# Patient Record
Sex: Female | Born: 1975 | Race: Black or African American | Hispanic: No | Marital: Married | Smoking: Never smoker
Health system: Southern US, Community
[De-identification: ages and names within clinical notes are randomized; demographics above are authoritative.]

## PROBLEM LIST (undated history)

## (undated) DIAGNOSIS — Z8489 Family history of other specified conditions: Secondary | ICD-10-CM

## (undated) DIAGNOSIS — Z87442 Personal history of urinary calculi: Secondary | ICD-10-CM

## (undated) HISTORY — PX: TONSILLECTOMY: SUR1361

## (undated) HISTORY — PX: HERNIA REPAIR: SHX51

## (undated) HISTORY — PX: KNEE SURGERY: SHX244

## (undated) HISTORY — PX: ABDOMINAL HYSTERECTOMY: SHX81

---

## 2012-05-06 HISTORY — PX: BREAST SURGERY: SHX581

## 2017-05-06 DIAGNOSIS — I209 Angina pectoris, unspecified: Secondary | ICD-10-CM

## 2017-05-06 HISTORY — DX: Angina pectoris, unspecified: I20.9

## 2017-12-10 DIAGNOSIS — R0609 Other forms of dyspnea: Secondary | ICD-10-CM | POA: Insufficient documentation

## 2017-12-10 DIAGNOSIS — M7989 Other specified soft tissue disorders: Secondary | ICD-10-CM | POA: Insufficient documentation

## 2018-06-22 DIAGNOSIS — R609 Edema, unspecified: Secondary | ICD-10-CM | POA: Insufficient documentation

## 2018-06-22 DIAGNOSIS — I872 Venous insufficiency (chronic) (peripheral): Secondary | ICD-10-CM | POA: Insufficient documentation

## 2018-06-22 DIAGNOSIS — R600 Localized edema: Secondary | ICD-10-CM | POA: Insufficient documentation

## 2019-08-04 ENCOUNTER — Other Ambulatory Visit: Payer: Self-pay

## 2019-08-04 ENCOUNTER — Encounter: Payer: Self-pay | Admitting: Podiatry

## 2019-08-04 ENCOUNTER — Ambulatory Visit: Payer: BC Managed Care – PPO | Admitting: Podiatry

## 2019-08-04 VITALS — Temp 98.0°F

## 2019-08-04 DIAGNOSIS — B351 Tinea unguium: Secondary | ICD-10-CM | POA: Diagnosis not present

## 2019-08-04 DIAGNOSIS — L600 Ingrowing nail: Secondary | ICD-10-CM

## 2019-08-04 NOTE — Addendum Note (Signed)
Addended by: Celene Skeen A on: 08/04/2019 02:33 PM   Modules accepted: Orders

## 2019-08-04 NOTE — Patient Instructions (Signed)

## 2019-08-04 NOTE — Addendum Note (Signed)
Addended by: Celene Skeen A on: 08/04/2019 02:52 PM   Modules accepted: Orders

## 2019-08-04 NOTE — Progress Notes (Signed)
Subjective:   Patient ID: Nancy Reid, female   DOB: 44 y.o.   MRN: [id number]   HPI Patient presents stating my big toenail right has given me trouble off and on and I know someday I will probably have to have it removed permanently but I do not want to do that if I do not have to in my third nail also my right is been very effective.  States is been going on for a long time and patient does not smoke and likes to be active but is having trouble wearing shoes with the pain   Review of Systems  All other systems reviewed and are negative.       Objective:  Physical Exam Vitals and nursing note reviewed.  Constitutional:      Appearance: She is well-developed.  Pulmonary:     Effort: Pulmonary effort is normal.  Musculoskeletal:        General: Normal range of motion.  Skin:    General: Skin is warm.  Neurological:     Mental Status: She is alert.     Neurovascular status intact muscle strength adequate range of motion within normal limits with patient noted to have a thickened dystrophic right hallux nail that is loose and moderately painful when palpated.  Third nails also thickened but not loose and patient has good digital perfusion well oriented x3     Assessment:  Probability for damaged hallux nail and possibility for mycotic component also present     Plan:  H&P reviewed both conditions and working to remove the nail but she does not want it permanent and I did explain it will recur and will most likely be thick again or loose again and that long-term is probably not require removal.  She understands but does not want to do today and we are going to send off for fungal culture and consider oral medications or laser  Anesthetized the hallux 60 mg like Marcaine mixture sterile prep applied and went ahead and remove the nail completely cleaned out the bed flushed the area and applied sterile dressing.  Sending off for culture of the nailbed for fungus and we will see the  results and decide what might be appropriate

## 2019-08-05 ENCOUNTER — Telehealth: Payer: Self-pay | Admitting: *Deleted

## 2019-08-05 MED ORDER — IBUPROFEN 800 MG PO TABS: 800.0000 mg | ORAL_TABLET | Freq: Three times a day (TID) | ORAL | 0 refills | Status: DC | PRN

## 2019-08-05 NOTE — Telephone Encounter (Signed)
Pt states she had an ingrown toenail procedure yesterday and is in terrible pain and would like Ibuprofen 800mg .

## 2019-08-05 NOTE — Telephone Encounter (Signed)
I spoke with pt and asked if she had begun the soaking and she stated not yet she was in too much pain. I told pt the removal of the snug post op dressing would give her some relief and so would the soaking. I told her Dr. Paulla Dolly had ordered the Ibuprofen and she could take it every 8 hours and could take regular strength tylenol as the package instructs in between the ibuprofen for additional pain coverage. Pt states understanding.

## 2019-09-02 ENCOUNTER — Encounter: Payer: Self-pay | Admitting: Podiatry

## 2019-09-02 ENCOUNTER — Other Ambulatory Visit: Payer: Self-pay

## 2019-09-02 ENCOUNTER — Telehealth: Payer: Self-pay | Admitting: *Deleted

## 2019-09-02 ENCOUNTER — Ambulatory Visit: Payer: BC Managed Care – PPO | Admitting: Podiatry

## 2019-09-02 VITALS — Temp 97.3°F

## 2019-09-02 DIAGNOSIS — B351 Tinea unguium: Secondary | ICD-10-CM

## 2019-09-02 DIAGNOSIS — L03031 Cellulitis of right toe: Secondary | ICD-10-CM

## 2019-09-02 LAB — HEPATIC FUNCTION PANEL
AG Ratio: 1.5 (calc) (ref 1.0–2.5)
ALT: 7 U/L (ref 6–29)
AST: 14 U/L (ref 10–30)
Albumin: 4.2 g/dL (ref 3.6–5.1)
Alkaline phosphatase (APISO): 41 U/L (ref 31–125)
Bilirubin, Direct: 0.1 mg/dL (ref 0.0–0.2)
Globulin: 2.8 g/dL (calc) (ref 1.9–3.7)
Indirect Bilirubin: 0.3 mg/dL (calc) (ref 0.2–1.2)
Total Bilirubin: 0.4 mg/dL (ref 0.2–1.2)
Total Protein: 7 g/dL (ref 6.1–8.1)

## 2019-09-02 MED ORDER — TERBINAFINE HCL 250 MG PO TABS: 250.0000 mg | ORAL_TABLET | Freq: Every day | ORAL | 0 refills | Status: DC

## 2019-09-02 NOTE — Telephone Encounter (Signed)
Pt states she was seen by Dr. Paulla Dolly today and did not pick up the cushion she was to have in her shoe.

## 2019-09-02 NOTE — Patient Instructions (Signed)

## 2019-09-02 NOTE — Progress Notes (Signed)
Subjective:   Patient ID: Nancy Reid, female   DOB: 44 y.o.   MRN: [id number]   HPI Patient presents stating that it still tender in her toe proximal to where the nail was removed with an area of inflammation and wants to also review her cultures today   ROS      Objective:  Physical Exam  Neurovascular status found to be intact with patient's right hallux nail that was removed healing well but there is some crusted tissue and on the lateral side proximal there is some inflammation underneath the nailbed.  It is painful when pressed and irritated.  Patient does have moderate fungal infiltration     Assessment:  Possibility for an inflammatory condition or possibility for abscess or infective pathology with mycotic nail     Plan:  H&P educated her on this and at this point I want to go ahead and clean the nailbed up and explore this lateral side to make sure there is no large abscess.  I infiltrated the hallux 60 mg like Marcaine mixture using sterile instrumentation I removed the new growth nail plate I cleaned that out I found there to be some inflammation of the lateral side but I did not note any pus.  I flushed the area out applied sterile dressing and we will get her started on an antifungal Lamisil unexplained risk and I do want to get liver function studies done.  Patient will be seen back in 6 months to ascertain how a new nail regrows and she understands ultimately may require permanent removal and she will be seen back at that time

## 2019-10-08 ENCOUNTER — Telehealth: Payer: Self-pay | Admitting: Podiatry

## 2019-10-08 NOTE — Telephone Encounter (Signed)
Pt called stating her toe was red and felt warm to touch I offered appt next week with regal the providers in offivce today 10/08/19 were at their copacity

## 2019-10-13 ENCOUNTER — Ambulatory Visit (INDEPENDENT_AMBULATORY_CARE_PROVIDER_SITE_OTHER): Payer: BC Managed Care – PPO

## 2019-10-13 ENCOUNTER — Other Ambulatory Visit: Payer: Self-pay | Admitting: Podiatry

## 2019-10-13 ENCOUNTER — Ambulatory Visit: Payer: BC Managed Care – PPO | Admitting: Podiatry

## 2019-10-13 ENCOUNTER — Other Ambulatory Visit: Payer: Self-pay

## 2019-10-13 VITALS — Temp 98.7°F

## 2019-10-13 DIAGNOSIS — L02611 Cutaneous abscess of right foot: Secondary | ICD-10-CM

## 2019-10-13 DIAGNOSIS — M79674 Pain in right toe(s): Secondary | ICD-10-CM

## 2019-10-13 DIAGNOSIS — L03031 Cellulitis of right toe: Secondary | ICD-10-CM

## 2019-10-13 MED ORDER — HYDROCODONE-ACETAMINOPHEN 10-325 MG PO TABS: 1.0000 | ORAL_TABLET | Freq: Three times a day (TID) | ORAL | 0 refills | Status: AC | PRN

## 2019-10-13 MED ORDER — AMOXICILLIN-POT CLAVULANATE 875-125 MG PO TABS: 1.0000 | ORAL_TABLET | Freq: Two times a day (BID) | ORAL | 0 refills | Status: DC

## 2019-10-13 NOTE — Progress Notes (Signed)
Subjective:   Patient ID: Nancy Reid, female   DOB: 44 y.o.   MRN: [id number]   HPI Patient states her toe was doing fine and then 1 week ago it started to swell and become very painful over the last few days.  She saw her family physician and had an x-ray which was negative for any bone infection was placed on doxycycline and has had blood work done.  It did indicate her fasting sugar was elevated at 125 but she had no elevation of her white blood count or other indications of infective process   ROS      Objective:  Physical Exam  Neurovascular status was intact with patient found to have a swollen right hallux with at the inner phalangeal joint a small area of discoloration of the tissue on the lateral side with possible abscess of this tissue.  Patient had no proximal edema erythema drainage noted and her temperature was normal and is been taken several times     Assessment:  Appearance of a localized infection abscess process right hallux with a well-healed nail site with crusted tissue with the area of abscess at the inner phalangeal joint     Plan:  H&P precautionary x-rays taken in today I did a proximal nerve block of the area.  Utilizing a 15 blade I opened up the area and did note a purulent pus formation.  I cultured this and I was able to express out a significant amount of purulent fluid which reduce the pressure against the digit.  I left it open and applied a sterile dressing and instructed on elevation of the foot surgical shoe usage which was dispensed today and due to the fact that it looks strictly like staph type infection I placed her on Augmentin 875 versus the doxycycline which I am hoping will be more effective.  We will get the results of culture fairly soon and I gave her strict instructions to take her temperature 3 times a day keep her foot elevated and if any redness were to occur in her foot or any systemic signs of infection were to occur she is to go straight  to the emergency room for IV antibiotics.  I did tell her that it is possible organ and need to open this up more if abscess was not resolved but I am hopeful what we did today we will do that along with the antibiotic soaks and taking it easy on the foot.  Patient will be seen back 1 week and I gave her my cell phone number if any issues were to occur.  I also wrote her for hydrocodone for pain relief  X-rays indicate that there is no signs of osteolysis of the bone or soft tissue bubbling

## 2019-10-15 ENCOUNTER — Telehealth: Payer: Self-pay | Admitting: *Deleted

## 2019-10-15 NOTE — Telephone Encounter (Signed)
I reviewed the clinicals and informed pt Dr. Paulla Dolly had wanted her on the augmentin only.

## 2019-10-15 NOTE — Telephone Encounter (Signed)
Patient is having taking 2 antibiotics (Doxycycline, Augmentin)and now is nauseous. Can she stop taking one of those and maybe call in Prednisone or something else?

## 2019-10-18 LAB — WOUND CULTURE
MICRO NUMBER:: 10576497
SPECIMEN QUALITY:: ADEQUATE

## 2019-10-20 ENCOUNTER — Other Ambulatory Visit: Payer: Self-pay

## 2019-10-20 ENCOUNTER — Ambulatory Visit: Payer: BC Managed Care – PPO | Admitting: Podiatry

## 2019-10-20 ENCOUNTER — Encounter: Payer: Self-pay | Admitting: Podiatry

## 2019-10-20 DIAGNOSIS — L02611 Cutaneous abscess of right foot: Secondary | ICD-10-CM

## 2019-10-20 DIAGNOSIS — L03031 Cellulitis of right toe: Secondary | ICD-10-CM

## 2019-10-20 MED ORDER — DOXYCYCLINE HYCLATE 100 MG PO TABS: 100.0000 mg | ORAL_TABLET | Freq: Two times a day (BID) | ORAL | 0 refills | Status: DC

## 2019-10-21 NOTE — Progress Notes (Signed)
Subjective:   Patient ID: Nancy Reid, female   DOB: 44 y.o.   MRN: [id number]   HPI Patient states she is feeling much better but the Augmentin is been strong for her so she is only been taking 1 a day and she did better with the doxycycline.  States that the swelling has really gone down and she did not note any drainage today but did have light drainage up till that time   ROS      Objective:  Physical Exam  Neurovascular status intact with patient's right hallux looking much better the small incision be made at the inner phalangeal joint healed with edema which has reduced dramatically.  There is mild tenderness when I pressed at the top of the big toe but improved from previous visit and no proximal edema erythema or drainage noted     Assessment:  Improving from what appears to be an abscess in the inner phalangeal joint right big toe with good response to I&D drainage soaks and antibiotics     Plan:  H&P reviewed condition and applied sterile dressing.  I do think that we should continue antibiotics for about 10 more days and at this point I went ahead and I want her to take doxycycline if she can tolerate this better and I gave her strict instructions of any swelling should recur or any pathology to let us know but I am hoping this will be the end of the problem and I educated her on continued soaks compression being careful in dressings

## 2019-11-26 NOTE — Patient Instructions (Addendum)
Get your Covid test at [facility] in [place] on 12/06/19 at:  10:05   [place] procedure is scheduled on 12/09/19   Report to [facility]  At  6:00  A.M.   Call this number if you have problems the morning of [phone]   OUR ADDRESS IS [place], WE ARE LOCATED IN THE [facility]ICAL [facility] WITH [facility].   Remember:  Do not eat food or drink liquids after midnight.   Take these medicines the morning of surgery with A SIP OF WATER: Lamisil   Do not wear jewelry, make-up or nail polish.  Do not wear lotions, powders, or perfumes, or deoderant.  Do not shave 48 hours prior to surgery.   Do not bring valuables to the hospital.  [facility] is not responsible for any belongings or valuables.  Contacts, dentures or bridgework may not be worn into surgery.    For patients admitted to the hospital, discharge time will be determined by your treatment team.  Patients discharged the day of surgery will not be allowed to drive home.   Special instructions:  Bring your prescription meds in their original bottles  Please read over the following fact sheets that you were given:       [facility] - Preparing for Surgery Before surgery, you can play an important role .  Because skin is not sterile, your skin needs to be as free of germs as possible.   You can reduce the number of germs on your skin by washing with CHG (chlorahexidine gluconate) soap before surgery.   CHG is an antiseptic cleaner which kills germs and bonds with the skin to continue killing germs even after washing. Please DO NOT use if you have an allergy to CHG or antibacterial soaps.   If your skin becomes reddened/irritated stop using the CHG and inform your nurse when you arrive at Short Stay. Do not shave (including legs and underarms) for at least 48 hours prior to the first CHG shower.   Please follow these instructions carefully:  1.  Shower with CHG  Soap the night before surgery and the  morning of Surgery.  2.  If you choose to wash your hair, wash your hair first as usual with your  normal  shampoo.  3.  After you shampoo, rinse your hair and body thoroughly to remove the  shampoo.                                        4.  Use CHG as you would any other liquid soap.  You can apply chg directly  to the skin and wash                       Gently with a scrungie or clean washcloth.  5.  Apply the CHG Soap to your body ONLY FROM THE NECK DOWN.   Do not use on face/ open                           Wound or open sores. Avoid contact with eyes, ears mouth and genitals (private parts).                       Wash face,  Genitals (  private parts) with your normal soap.             6.  Wash thoroughly, paying special attention to the area where your surgery  will be performed.  7.  Thoroughly rinse your body with warm water from the neck down.  8.  DO NOT shower/wash with your normal soap after using and rinsing off  the CHG Soap.             9.  Pat yourself dry with a clean towel.            10.  Wear clean pajamas.            11.  Place clean sheets on your bed the night of your first shower and do not  sleep with pets. Day of Surgery : Do not apply any lotions/deodorants the morning of surgery.  Please wear clean clothes to the hospital/surgery center.  FAILURE TO FOLLOW THESE INSTRUCTIONS MAY RESULT IN THE CANCELLATION OF YOUR SURGERY PATIENT SIGNATURE_________________________________  NURSE SIGNATURE__________________________________  ________________________________________________________________________

## 2019-11-29 ENCOUNTER — Encounter (HOSPITAL_COMMUNITY)
Admission: RE | Admit: 2019-11-29 | Discharge: 2019-11-29 | Disposition: A | Discharge status: Home / Self Care | Payer: BC Managed Care – PPO | Source: Ambulatory Visit | Attending: Obstetrics and Gynecology | Admitting: Obstetrics and Gynecology

## 2019-11-29 ENCOUNTER — Encounter (HOSPITAL_COMMUNITY): Payer: Self-pay

## 2019-11-29 ENCOUNTER — Other Ambulatory Visit: Payer: Self-pay

## 2019-11-29 DIAGNOSIS — Z01812 Encounter for preprocedural laboratory examination: Secondary | ICD-10-CM | POA: Insufficient documentation

## 2019-11-29 HISTORY — DX: Family history of other specified conditions: Z84.89

## 2019-11-29 HISTORY — DX: Personal history of urinary calculi: Z87.442

## 2019-11-29 LAB — CBC
HCT: 41.2 % (ref 36.0–46.0)
Hemoglobin: 13.1 g/dL (ref 12.0–15.0)
MCH: 30 pg (ref 26.0–34.0)
MCHC: 31.8 g/dL (ref 30.0–36.0)
MCV: 94.5 fL (ref 80.0–100.0)
Platelets: 301 10*3/uL (ref 150–400)
RBC: 4.36 MIL/uL (ref 3.87–5.11)
RDW: 12.8 % (ref 11.5–15.5)
WBC: 4.5 10*3/uL (ref 4.0–10.5)
nRBC: 0 % (ref 0.0–0.2)

## 2019-12-06 ENCOUNTER — Other Ambulatory Visit (HOSPITAL_COMMUNITY)
Admission: RE | Admit: 2019-12-06 | Discharge: 2019-12-06 | Disposition: A | Discharge status: Home / Self Care | Payer: BC Managed Care – PPO | Source: Ambulatory Visit | Attending: Obstetrics and Gynecology | Admitting: Obstetrics and Gynecology

## 2019-12-06 DIAGNOSIS — Z20822 Contact with and (suspected) exposure to covid-19: Secondary | ICD-10-CM | POA: Insufficient documentation

## 2019-12-06 DIAGNOSIS — Z01812 Encounter for preprocedural laboratory examination: Secondary | ICD-10-CM | POA: Insufficient documentation

## 2019-12-06 LAB — SARS CORONAVIRUS 2 (TAT 6-24 HRS): SARS Coronavirus 2: NEGATIVE

## 2019-12-08 NOTE — Anesthesia Preprocedure Evaluation (Addendum)
Anesthesia Evaluation  Patient identified by MRN, date of birth, ID band Patient awake    Reviewed: Allergy & Precautions, NPO status , Patient's Chart, lab work & pertinent test results  Airway Mallampati: II  TM Distance: >3 FB Neck ROM: Full    Dental no notable dental hx.    Pulmonary neg pulmonary ROS,    Pulmonary exam normal breath sounds clear to auscultation       Cardiovascular with exertion Normal cardiovascular exam Rhythm:Regular Rate:Normal  Patient evaluated by cardiology in 2019. Negative cath.    Neuro/Psych negative neurological ROS  negative psych ROS   GI/Hepatic negative GI ROS, Neg liver ROS,   Endo/Other  negative endocrine ROS  Renal/GU negative Renal ROS     Musculoskeletal negative musculoskeletal ROS (+)   Abdominal (+) + obese,   Peds  Hematology negative hematology ROS (+)   Anesthesia Other Findings ABNORMAL BLEEDING  Reproductive/Obstetrics                           Anesthesia Physical Anesthesia Plan  ASA: II  Anesthesia Plan: General   Post-op Pain Management:    Induction: Intravenous  PONV Risk Score and Plan: 4 or greater and Ondansetron, Dexamethasone, Midazolam, Scopolamine patch - Pre-op and Treatment may vary due to age or medical condition  Airway Management Planned: Oral ETT  Additional Equipment:   Intra-op Plan:   Post-operative Plan: Extubation in OR  Informed Consent: I have reviewed the patients History and Physical, chart, labs and discussed the procedure including the risks, benefits and alternatives for the proposed anesthesia with the patient or authorized representative who has indicated his/her understanding and acceptance.     Dental advisory given  Plan Discussed with: CRNA  Anesthesia Plan Comments:         Anesthesia Quick Evaluation

## 2019-12-09 ENCOUNTER — Ambulatory Visit (HOSPITAL_BASED_OUTPATIENT_CLINIC_OR_DEPARTMENT_OTHER): Payer: BC Managed Care – PPO | Admitting: Physician Assistant

## 2019-12-09 ENCOUNTER — Encounter (HOSPITAL_BASED_OUTPATIENT_CLINIC_OR_DEPARTMENT_OTHER): Payer: Self-pay | Admitting: Obstetrics and Gynecology

## 2019-12-09 ENCOUNTER — Ambulatory Visit (HOSPITAL_BASED_OUTPATIENT_CLINIC_OR_DEPARTMENT_OTHER)
Admission: RE | Admit: 2019-12-09 | Discharge: 2019-12-09 | Disposition: A | Discharge status: Home / Self Care | Payer: BC Managed Care – PPO | Attending: Obstetrics and Gynecology | Admitting: Obstetrics and Gynecology

## 2019-12-09 ENCOUNTER — Encounter (HOSPITAL_BASED_OUTPATIENT_CLINIC_OR_DEPARTMENT_OTHER)
Admission: RE | Disposition: A | Discharge status: Home / Self Care | Payer: Self-pay | Source: Home / Self Care | Attending: Obstetrics and Gynecology

## 2019-12-09 ENCOUNTER — Ambulatory Visit (HOSPITAL_BASED_OUTPATIENT_CLINIC_OR_DEPARTMENT_OTHER): Payer: BC Managed Care – PPO | Admitting: Anesthesiology

## 2019-12-09 ENCOUNTER — Other Ambulatory Visit: Payer: Self-pay

## 2019-12-09 DIAGNOSIS — Z79899 Other long term (current) drug therapy: Secondary | ICD-10-CM | POA: Diagnosis not present

## 2019-12-09 DIAGNOSIS — N946 Dysmenorrhea, unspecified: Secondary | ICD-10-CM | POA: Diagnosis not present

## 2019-12-09 DIAGNOSIS — D259 Leiomyoma of uterus, unspecified: Secondary | ICD-10-CM | POA: Diagnosis present

## 2019-12-09 DIAGNOSIS — Z793 Long term (current) use of hormonal contraceptives: Secondary | ICD-10-CM | POA: Diagnosis not present

## 2019-12-09 DIAGNOSIS — N938 Other specified abnormal uterine and vaginal bleeding: Secondary | ICD-10-CM | POA: Diagnosis present

## 2019-12-09 DIAGNOSIS — D261 Other benign neoplasm of corpus uteri: Secondary | ICD-10-CM | POA: Diagnosis not present

## 2019-12-09 DIAGNOSIS — N888 Other specified noninflammatory disorders of cervix uteri: Secondary | ICD-10-CM | POA: Insufficient documentation

## 2019-12-09 DIAGNOSIS — Z9851 Tubal ligation status: Secondary | ICD-10-CM | POA: Diagnosis not present

## 2019-12-09 DIAGNOSIS — N838 Other noninflammatory disorders of ovary, fallopian tube and broad ligament: Secondary | ICD-10-CM | POA: Diagnosis not present

## 2019-12-09 HISTORY — PX: TOTAL LAPAROSCOPIC HYSTERECTOMY WITH SALPINGECTOMY: SHX6742

## 2019-12-09 HISTORY — PX: CYSTOSCOPY: SHX5120

## 2019-12-09 LAB — TYPE AND SCREEN
ABO/RH(D): O POS
Antibody Screen: NEGATIVE

## 2019-12-09 LAB — POCT PREGNANCY, URINE: Preg Test, Ur: NEGATIVE

## 2019-12-09 LAB — ABO/RH: ABO/RH(D): O POS

## 2019-12-09 SURGERY — HYSTERECTOMY, TOTAL, LAPAROSCOPIC, WITH SALPINGECTOMY
Anesthesia: General | Site: Bladder | Laterality: Bilateral

## 2019-12-09 MED ORDER — OXYCODONE HCL 5 MG PO TABS: 5.0000 mg | ORAL_TABLET | Freq: Four times a day (QID) | ORAL | 0 refills | Status: DC | PRN

## 2019-12-09 MED ORDER — OXYCODONE HCL 5 MG PO TABS
5.0000 mg | ORAL_TABLET | ORAL | Status: DC | PRN
  Administered 2019-12-09: 5 mg via ORAL

## 2019-12-09 MED ORDER — OXYCODONE HCL 5 MG PO TABS
ORAL_TABLET | ORAL | Status: AC
  Filled 2019-12-09: qty 1

## 2019-12-09 MED ORDER — SENNOSIDES-DOCUSATE SODIUM 8.6-50 MG PO TABS
1.0000 | ORAL_TABLET | Freq: Every evening | ORAL | Status: DC | PRN
  Filled 2019-12-09: qty 1

## 2019-12-09 MED ORDER — ONDANSETRON HCL 4 MG/2ML IJ SOLN
INTRAMUSCULAR | Status: DC | PRN
  Administered 2019-12-09: 4 mg via INTRAVENOUS

## 2019-12-09 MED ORDER — CEFAZOLIN SODIUM-DEXTROSE 2-4 GM/100ML-% IV SOLN
2.0000 g | INTRAVENOUS | Status: AC
  Administered 2019-12-09: 2 g via INTRAVENOUS

## 2019-12-09 MED ORDER — LACTATED RINGERS IV SOLN: INTRAVENOUS | Status: DC

## 2019-12-09 MED ORDER — FENTANYL CITRATE (PF) 250 MCG/5ML IJ SOLN
INTRAMUSCULAR | Status: AC
  Filled 2019-12-09: qty 5

## 2019-12-09 MED ORDER — ACETAMINOPHEN 500 MG PO TABS
1000.0000 mg | ORAL_TABLET | ORAL | Status: AC
  Administered 2019-12-09: 1000 mg via ORAL

## 2019-12-09 MED ORDER — ONDANSETRON HCL 4 MG/2ML IJ SOLN
INTRAMUSCULAR | Status: AC
  Filled 2019-12-09: qty 2

## 2019-12-09 MED ORDER — SCOPOLAMINE 1 MG/3DAYS TD PT72
1.0000 | MEDICATED_PATCH | TRANSDERMAL | Status: DC
  Administered 2019-12-09: 1.5 mg via TRANSDERMAL

## 2019-12-09 MED ORDER — FENTANYL CITRATE (PF) 250 MCG/5ML IJ SOLN
INTRAMUSCULAR | Status: DC | PRN
  Administered 2019-12-09: 100 ug via INTRAVENOUS
  Administered 2019-12-09 (×2): 50 ug via INTRAVENOUS

## 2019-12-09 MED ORDER — MIDAZOLAM HCL 2 MG/2ML IJ SOLN
INTRAMUSCULAR | Status: AC
  Filled 2019-12-09: qty 2

## 2019-12-09 MED ORDER — HYDROMORPHONE HCL 1 MG/ML IJ SOLN
0.2500 mg | INTRAMUSCULAR | Status: DC | PRN
  Administered 2019-12-09 (×4): 0.25 mg via INTRAVENOUS

## 2019-12-09 MED ORDER — ROCURONIUM BROMIDE 10 MG/ML (PF) SYRINGE
PREFILLED_SYRINGE | INTRAVENOUS | Status: DC | PRN
  Administered 2019-12-09: 70 mg via INTRAVENOUS
  Administered 2019-12-09: 20 mg via INTRAVENOUS
  Administered 2019-12-09: 10 mg via INTRAVENOUS

## 2019-12-09 MED ORDER — KETOROLAC TROMETHAMINE 30 MG/ML IJ SOLN
INTRAMUSCULAR | Status: DC | PRN
  Administered 2019-12-09: 30 mg via INTRAVENOUS

## 2019-12-09 MED ORDER — IBUPROFEN 800 MG PO TABS
ORAL_TABLET | ORAL | Status: AC
  Filled 2019-12-09: qty 1

## 2019-12-09 MED ORDER — DEXAMETHASONE SODIUM PHOSPHATE 10 MG/ML IJ SOLN
INTRAMUSCULAR | Status: DC | PRN
  Administered 2019-12-09: 10 mg via INTRAVENOUS

## 2019-12-09 MED ORDER — MIDAZOLAM HCL 5 MG/5ML IJ SOLN
INTRAMUSCULAR | Status: DC | PRN
  Administered 2019-12-09: 2 mg via INTRAVENOUS

## 2019-12-09 MED ORDER — IBUPROFEN 800 MG PO TABS: 800.0000 mg | ORAL_TABLET | Freq: Three times a day (TID) | ORAL | 1 refills | Status: DC

## 2019-12-09 MED ORDER — OXYCODONE HCL 5 MG PO TABS
5.0000 mg | ORAL_TABLET | Freq: Once | ORAL | Status: AC | PRN
  Administered 2019-12-09: 5 mg via ORAL

## 2019-12-09 MED ORDER — FLUORESCEIN SODIUM 10 % IV SOLN
INTRAVENOUS | Status: DC | PRN
Start: 2019-12-09 — End: 2019-12-09
  Administered 2019-12-09: .5 mL via INTRAVENOUS

## 2019-12-09 MED ORDER — LIDOCAINE 2% (20 MG/ML) 5 ML SYRINGE
INTRAMUSCULAR | Status: AC
  Filled 2019-12-09: qty 5

## 2019-12-09 MED ORDER — OXYCODONE HCL 5 MG/5ML PO SOLN: 5.0000 mg | Freq: Once | ORAL | Status: AC | PRN

## 2019-12-09 MED ORDER — KETOROLAC TROMETHAMINE 30 MG/ML IJ SOLN
INTRAMUSCULAR | Status: AC
  Filled 2019-12-09: qty 1

## 2019-12-09 MED ORDER — PROPOFOL 10 MG/ML IV BOLUS
INTRAVENOUS | Status: AC
  Filled 2019-12-09: qty 40

## 2019-12-09 MED ORDER — SCOPOLAMINE 1 MG/3DAYS TD PT72
MEDICATED_PATCH | TRANSDERMAL | Status: AC
  Filled 2019-12-09: qty 1

## 2019-12-09 MED ORDER — SUGAMMADEX SODIUM 200 MG/2ML IV SOLN
INTRAVENOUS | Status: DC | PRN
  Administered 2019-12-09: 250 mg via INTRAVENOUS

## 2019-12-09 MED ORDER — SIMETHICONE 80 MG PO CHEW: 80.0000 mg | CHEWABLE_TABLET | Freq: Four times a day (QID) | ORAL | Status: DC | PRN

## 2019-12-09 MED ORDER — PROMETHAZINE HCL 25 MG/ML IJ SOLN: 6.2500 mg | INTRAMUSCULAR | Status: DC | PRN

## 2019-12-09 MED ORDER — MENTHOL 3 MG MT LOZG: 1.0000 | LOZENGE | OROMUCOSAL | Status: DC | PRN

## 2019-12-09 MED ORDER — HYDROMORPHONE HCL 1 MG/ML IJ SOLN
INTRAMUSCULAR | Status: AC
  Filled 2019-12-09: qty 1

## 2019-12-09 MED ORDER — ROCURONIUM BROMIDE 10 MG/ML (PF) SYRINGE
PREFILLED_SYRINGE | INTRAVENOUS | Status: AC
  Filled 2019-12-09: qty 10

## 2019-12-09 MED ORDER — DEXAMETHASONE SODIUM PHOSPHATE 10 MG/ML IJ SOLN
INTRAMUSCULAR | Status: AC
  Filled 2019-12-09: qty 1

## 2019-12-09 MED ORDER — PROPOFOL 10 MG/ML IV BOLUS
INTRAVENOUS | Status: DC | PRN
  Administered 2019-12-09: 200 mg via INTRAVENOUS

## 2019-12-09 MED ORDER — KETOROLAC TROMETHAMINE 30 MG/ML IJ SOLN: 30.0000 mg | Freq: Once | INTRAMUSCULAR | Status: DC | PRN

## 2019-12-09 MED ORDER — FLUORESCEIN SODIUM 10 % IV SOLN
INTRAVENOUS | Status: AC
  Filled 2019-12-09: qty 5

## 2019-12-09 MED ORDER — SODIUM CHLORIDE 0.9 % IR SOLN
Status: DC | PRN
  Administered 2019-12-09: 3000 mL
  Administered 2019-12-09: 1000 mL

## 2019-12-09 MED ORDER — CEFAZOLIN SODIUM-DEXTROSE 2-4 GM/100ML-% IV SOLN
INTRAVENOUS | Status: AC
  Filled 2019-12-09: qty 100

## 2019-12-09 MED ORDER — DEXMEDETOMIDINE HCL IN NACL 200 MCG/50ML IV SOLN
INTRAVENOUS | Status: DC | PRN
  Administered 2019-12-09 (×2): 8 ug via INTRAVENOUS
  Administered 2019-12-09 (×2): 4 ug via INTRAVENOUS
  Administered 2019-12-09: 8 ug via INTRAVENOUS

## 2019-12-09 MED ORDER — ONDANSETRON HCL 4 MG PO TABS: 4.0000 mg | ORAL_TABLET | Freq: Four times a day (QID) | ORAL | 0 refills | Status: DC | PRN

## 2019-12-09 MED ORDER — ONDANSETRON HCL 4 MG/2ML IJ SOLN: 4.0000 mg | Freq: Four times a day (QID) | INTRAMUSCULAR | Status: DC | PRN

## 2019-12-09 MED ORDER — DEXMEDETOMIDINE HCL IN NACL 200 MCG/50ML IV SOLN
INTRAVENOUS | Status: AC
  Filled 2019-12-09: qty 50

## 2019-12-09 MED ORDER — ONDANSETRON HCL 4 MG PO TABS: 4.0000 mg | ORAL_TABLET | Freq: Four times a day (QID) | ORAL | Status: DC | PRN

## 2019-12-09 MED ORDER — ACETAMINOPHEN 500 MG PO TABS
ORAL_TABLET | ORAL | Status: AC
  Filled 2019-12-09: qty 2

## 2019-12-09 MED ORDER — SIMETHICONE 80 MG PO CHEW: 80.0000 mg | CHEWABLE_TABLET | Freq: Four times a day (QID) | ORAL | 0 refills | Status: DC | PRN

## 2019-12-09 MED ORDER — BUPIVACAINE HCL (PF) 0.25 % IJ SOLN
INTRAMUSCULAR | Status: DC | PRN
  Administered 2019-12-09: 20 mL

## 2019-12-09 MED ORDER — LIDOCAINE 2% (20 MG/ML) 5 ML SYRINGE
INTRAMUSCULAR | Status: DC | PRN
  Administered 2019-12-09: 100 mg via INTRAVENOUS

## 2019-12-09 MED ORDER — IBUPROFEN 800 MG PO TABS
800.0000 mg | ORAL_TABLET | Freq: Three times a day (TID) | ORAL | Status: DC
  Administered 2019-12-09: 800 mg via ORAL

## 2019-12-09 SURGICAL SUPPLY — 51 items
ADH SKN CLS APL DERMABOND .7 (GAUZE/BANDAGES/DRESSINGS) ×2
COVER MAYO STAND STRL (DRAPES) ×3 IMPLANT
DERMABOND ADVANCED (GAUZE/BANDAGES/DRESSINGS) ×1
DERMABOND ADVANCED .7 DNX12 (GAUZE/BANDAGES/DRESSINGS) ×2 IMPLANT
DEVICE SUTURE ENDOST 10MM (ENDOMECHANICALS) IMPLANT
DURAPREP 26ML APPLICATOR (WOUND CARE) IMPLANT
ENDOSTITCH 0 SINGLE 48 (SUTURE) IMPLANT
FILTER SMOKE EVAC LAPAROSHD (FILTER) ×3 IMPLANT
GLOVE BIO SURGEON STRL SZ 6.5 (GLOVE) ×12 IMPLANT
GLOVE BIO SURGEON STRL SZ7 (GLOVE) ×3 IMPLANT
GLOVE BIOGEL PI IND STRL 6.5 (GLOVE) ×4 IMPLANT
GLOVE BIOGEL PI IND STRL 7.0 (GLOVE) ×8 IMPLANT
GLOVE BIOGEL PI IND STRL 7.5 (GLOVE) ×6 IMPLANT
GLOVE BIOGEL PI INDICATOR 6.5 (GLOVE) ×2
GLOVE BIOGEL PI INDICATOR 7.0 (GLOVE) ×4
GLOVE BIOGEL PI INDICATOR 7.5 (GLOVE) ×3
GLOVE ECLIPSE 6.5 STRL STRAW (GLOVE) ×3 IMPLANT
GOWN STRL REUS W/ TWL LRG LVL3 (GOWN DISPOSABLE) ×10 IMPLANT
GOWN STRL REUS W/TWL LRG LVL3 (GOWN DISPOSABLE) ×15
HIBICLENS CHG 4% 4OZ (MISCELLANEOUS) ×3 IMPLANT
HOLDER FOLEY CATH W/STRAP (MISCELLANEOUS) ×3 IMPLANT
KIT TURNOVER CYSTO (KITS) ×3 IMPLANT
MANIPULATOR VCARE LG CRV RETR (MISCELLANEOUS) IMPLANT
MANIPULATOR VCARE SML CRV RETR (MISCELLANEOUS) IMPLANT
MANIPULATOR VCARE STD CRV RETR (MISCELLANEOUS) ×3 IMPLANT
NS IRRIG 1000ML POUR BTL (IV SOLUTION) ×3 IMPLANT
PACK LAPAROSCOPY BASIN (CUSTOM PROCEDURE TRAY) ×3 IMPLANT
PACK TRENDGUARD 450 HYBRID PRO (MISCELLANEOUS) IMPLANT
PACK TRENDGUARD 600 HYBRD PROC (MISCELLANEOUS) ×2 IMPLANT
PROTECTOR NERVE ULNAR (MISCELLANEOUS) IMPLANT
SCISSORS LAP 5X35 DISP (ENDOMECHANICALS) IMPLANT
SET IRRIG Y TYPE TUR BLADDER L (SET/KITS/TRAYS/PACK) ×3 IMPLANT
SET SUCTION IRRIG HYDROSURG (IRRIGATION / IRRIGATOR) ×3 IMPLANT
SET TRI-LUMEN FLTR TB AIRSEAL (TUBING) ×3 IMPLANT
SHEARS HARMONIC ACE PLUS 36CM (ENDOMECHANICALS) ×3 IMPLANT
SUT ENDO VLOC 180-0-8IN (SUTURE) ×3 IMPLANT
SUT MON AB 4-0 PS1 27 (SUTURE) ×6 IMPLANT
SUT VIC AB 3-0 PS2 18 (SUTURE) ×3
SUT VIC AB 3-0 PS2 18XBRD (SUTURE) ×2 IMPLANT
SUT VICRYL 0 UR6 27IN ABS (SUTURE) ×3 IMPLANT
SYSTEM CARTER THOMASON II (TROCAR) ×3 IMPLANT
TOWEL OR 17X26 10 PK STRL BLUE (TOWEL DISPOSABLE) ×3 IMPLANT
TRAY FOLEY W/BAG SLVR 14FR (SET/KITS/TRAYS/PACK) ×3 IMPLANT
TRAY FOLEY W/BAG SLVR 14FR LF (SET/KITS/TRAYS/PACK) ×3 IMPLANT
TRENDGUARD 450 HYBRID PRO PACK (MISCELLANEOUS)
TRENDGUARD 600 HYBRID PROC PK (MISCELLANEOUS) ×3
TROCAR PORT AIRSEAL 5X120 (TROCAR) ×3 IMPLANT
TROCAR XCEL NON-BLD 11X100MML (ENDOMECHANICALS) ×3 IMPLANT
TROCAR XCEL NON-BLD 5MMX100MML (ENDOMECHANICALS) ×3 IMPLANT
WARMER LAPAROSCOPE (MISCELLANEOUS) ×3 IMPLANT
WIPE CHG CHLORHEXIDINE 2% (PERSONAL CARE ITEMS) ×3 IMPLANT

## 2019-12-09 NOTE — H&P (Addendum)
Nancy Reid is an 44 y.o. female prsenting for scheduled surgery. No complaints this AM.   Pertinent Gynecological History: Menses: flow is excessive with use of 4-5 pads or tampons on heaviest days, regular every 24-28 days without intermenstrual spotting and with severe dysmenorrhea Bleeding: dysfunctional uterine bleeding Contraception: tubal ligation DES exposure: denies Blood transfusions: none Sexually transmitted diseases: no past history. Remote h/o HSV Previous GYN Procedures: none  Last mammogram: normal Date: 05/27/2019 Last pap: normal Date: 1/21/021 OB History: G2, P2002   Menstrual History: Menarche age: early teens Patient's last menstrual period was 10/09/2019 (approximate).    Past Medical History:  Diagnosis Date  . Anginal pain ([place]) 2019  . Family history of adverse reaction to anesthesia    N&V  . History of kidney stones     Past Surgical History:  Procedure Laterality Date  . BREAST SURGERY Bilateral 2014   reduction  . CESAREAN SECTION     x2  . HERNIA REPAIR     umbilical  . KNEE SURGERY Left    age 34  . TONSILLECTOMY      History reviewed. No pertinent family history.  Social History:  reports that she has never smoked. She has never used smokeless tobacco. No history on file for alcohol use and drug use.  Allergies:  Allergies  Allergen Reactions  . Sulfa Antibiotics Itching  . Iodine Itching  . Shellfish Allergy Itching and Swelling  . Sulfamethoxazole-Trimethoprim Itching    Medications Prior to Admission  Medication Sig Dispense Refill Last Dose  . Levonorgestrel-Ethinyl Estradiol (AMETHIA) 0.15-0.03 &0.01 MG tablet Take 1 tablet by mouth daily.     Marland Kitchen terbinafine (LAMISIL) 250 MG tablet Take 1 tablet (250 mg total) by mouth daily. 90 tablet 0   . valACYclovir (VALTREX) 500 MG tablet Take 500 mg by mouth daily.    12/08/2019 at Unknown time  . Vitamin D, Ergocalciferol, (DRISDOL) 1.25 MG (50000 UNIT) CAPS capsule Take 50,000  Units by mouth once a week.   Past Week at Unknown time  . amoxicillin-clavulanate (AUGMENTIN) 875-125 MG tablet Take 1 tablet by mouth 2 (two) times daily. (Patient not taking: Reported on 11/12/2019) 20 tablet 0 Not Taking at Unknown time  . doxycycline (VIBRA-TABS) 100 MG tablet Take 1 tablet (100 mg total) by mouth 2 (two) times daily. (Patient not taking: Reported on 11/12/2019) 20 tablet 0 Not Taking at Unknown time  . furosemide (LASIX) 40 MG tablet Take by mouth. (Patient not taking: Reported on 11/12/2019)   Not Taking at Unknown time  . hydrochlorothiazide (HYDRODIURIL) 25 MG tablet Take by mouth. (Patient not taking: Reported on 11/12/2019)   Not Taking at Unknown time  . ibuprofen (ADVIL) 800 MG tablet Take 1 tablet (800 mg total) by mouth every 8 (eight) hours as needed. (Patient not taking: Reported on 11/12/2019) 90 tablet 0 Not Taking at Unknown time  . JUNEL FE 1.5/30 1.5-30 MG-MCG tablet  (Patient not taking: Reported on 11/12/2019)   Not Taking at Unknown time    Review of Systems  Constitutional: Negative for chills and fever.  Respiratory: Negative for shortness of breath.   Cardiovascular: Negative for chest pain, palpitations and leg swelling.  Gastrointestinal: Negative for abdominal pain, nausea and vomiting.  Neurological: Negative for dizziness, weakness and headaches.  Psychiatric/Behavioral: Negative for suicidal ideas.    Blood pressure 125/71, pulse 80, temperature 98.8 F (37.1 C), temperature source Oral, resp. rate 16, height 5\' 6"  (1.676 m), weight 108.8 kg, last menstrual period  10/09/2019, SpO2 100 %. Physical Exam Gen: AAF in NAD CV: CTAB, RRR Abd: NTTP, soft, BSx4, Pfannenstiel healed Results for orders placed or performed during the hospital encounter of 12/09/19 (from the past 24 hour(s))  Pregnancy, urine POC     Status: None   Collection Time: 12/09/19  6:23 AM  Result Value Ref Range   Preg Test, Ur NEGATIVE NEGATIVE  ABO/Rh     Status: None   Collection  Time: 12/09/19  6:38 AM  Result Value Ref Range   ABO/RH(D)      O POS Performed at [facility], [place] [address], [place], [place] [address]     No results found.  Assessment/Plan: This is a 44yo [id number] presenting for scheduled TLH/BS/cysto for AUB-HMB, failed medical mgmt. PSurg s/f csx x2, BTL.  Risks of TLH include infection of the uterus, pelvic organs, or skin, inadvertent injury to internal organs, such as bowel or bladder. If there is major injury, extensive surgery may be required. If injury is minor, it may be treated with relative ease. Discussed possibility of excessive blood loss and transfusion. Patient aware that no future fertility will remain after procedure. Patient accepts the possibility of blood transfusion, if necessary. Bowel and/or bladder injury may require prolonged inpatient stay and possible colostomy, Foley catheter, etc, as deemed fit by other surgeon. Patient understands and agrees to move forward with surgery.  [place]ger 12/09/2019, 7:45 AM

## 2019-12-09 NOTE — Brief Op Note (Addendum)
12/09/2019  11:10 AM  PATIENT:  Nancy Reid  44 y.o. female  PRE-OPERATIVE DIAGNOSIS:  ABNORMAL BLEEDING  POST-OPERATIVE DIAGNOSIS:  ABNORMAL BLEEDING  PROCEDURE:  Procedure(s): TOTAL LAPAROSCOPIC HYSTERECTOMY WITH SALPINGECTOMY,LYSIS OF ADHESIONS (Bilateral) CYSTOSCOPY (Bilateral)  SURGEON:  Surgeon(s) and Role:    * Dagmawi Venable, Melida Quitter, MD - Primary    * Banga, Cecilia Worema, DO - Assisting  ANESTHESIA:   general  EBL:  400 mL  IVF 1200cc UOP 175cc  BLOOD ADMINISTERED:none  DRAINS: none   LOCAL MEDICATIONS USED:  MARCAINE     SPECIMEN: Bilateral fallopian tubes, uterus, cervix  DISPOSITION OF SPECIMEN:  PATHOLOGY  COUNTS:  Corresnt DICTATION: .Note written in EPIC  PLAN OF CARE: Admit for overnight observation  PATIENT DISPOSITION:  PACU - hemodynamically stable.   Delay start of Pharmacological VTE agent (>24hrs) due to surgical blood loss or risk of bleeding: yes

## 2019-12-09 NOTE — Anesthesia Postprocedure Evaluation (Signed)
Anesthesia Post Note  Patient: Agricultural consultant  Procedure(s) Performed: TOTAL LAPAROSCOPIC HYSTERECTOMY WITH SALPINGECTOMY,LYSIS OF ADHESIONS (Bilateral Abdomen) CYSTOSCOPY (Bilateral Bladder)     Patient location during evaluation: PACU Anesthesia Type: General Level of consciousness: awake and alert Pain management: pain level controlled Vital Signs Assessment: post-procedure vital signs reviewed and stable Respiratory status: spontaneous breathing, nonlabored ventilation, respiratory function stable and patient connected to nasal cannula oxygen Cardiovascular status: blood pressure returned to baseline and stable Postop Assessment: no apparent nausea or vomiting Anesthetic complications: no   No complications documented.  Last Vitals:  Vitals:   12/09/19 1230 12/09/19 1245  BP: 113/72 109/72  Pulse: 72 77  Resp: 15 17  Temp:  36.5 C  SpO2: 99% 97%    Last Pain:  Vitals:   12/09/19 1245  TempSrc:   PainSc: 5                  Kelsye Loomer P Shanavia Makela

## 2019-12-09 NOTE — Op Note (Signed)
12/09/19  Surgeon: Eula Flax, MD Assistant: Ladona Mow, DO Preoperative Diagnosis: AUB-HMB Postoperative Diagnosis: Same as above, left pelvic adhesions Procedure: Total laparoscopic hysterectomy with bilateral salpingectomy; cystoscopy  Anesthesia: General by Dr Roanna Banning IVF: 1200cc LR EBL: 400cc UOP: 175cc clear urine Complications: none Specimen: cervix, uterus and bilateral fallopian tubes to pathology Findings: External genitalia WNL. Bimanual exam reveals 8cm uterus with irregular contour deviated towards patient's left. Neg adnexal exam. Uterus sounded to 8cm. Upon laparoscopic evaluation, dense adhesions noted between left pelvic sidewall and left cornua and ovary   Indications. Ms Collene Mares is a 44yo G3P 3003 with chronic AUB-HMB and secondary dysmenorrhea who has failed medical management and desires surgical management in the form of hysterectomy.  TVUS showed 8.225cm anteverted uterus with 2.5x1.5cm anterior IM fibroid; some posterior myometrial thickening possibly adenomyosis   Operative Procedure: The patient was taken to the operating room where a time out was performed to confirm patient and correct procedure. The patient was given 2g Ancef in accordance with ACOG guidelines based on weight. General anesthesia was established. The patient was then positioned on the operating table in the dorsal lithotomy position with the legs supported using stirrups. All pressure points were padded and a Bair hugger was placed to maintain control of core body temperature.    The patient was then prepped and draped in the usual sterile fashion. A Foley catheter was inserted in normal sterile fashion. An operative speculum was placed into the vagina. A tenaculum was placed on anterior cervical lip. The uterus sounded to 8cm and a V-care uterine manipulator was placed in normal fashion with testing of bubble prior to placement and ensuring cup was placed circumferentially around cervix.     Attention was then turned the abdomen with 0.25% marcaine plain was injected subdermally infraumbilically. A 13mm vertical incision was made infraumbilically. A 73mm trochar and sleeve were inserted through the incision using direct visualization with laparoscope. Once intraabdominal entry was confirmed. Pneumoperitoneum was established using Airseal. Subsequently, two other small incision were made in left and right lower quadrants, both 2cm above and 2cm medial to corresponding ASIS's. LLQ site 31mm, RLQ site 28mm. Airseal port introduced in RLQ, 14mm trochar introduced in LLQ.    Attention was then turned to the right uterine fundus after laparoscopic survey showed findings as above. Firstly, left ovary and cornua freed from left pelvic brim using harmonic scalpel. Then, the left broad ligament was then grasped, coagulated and cut using Harmonic scalpel. Process was continued through mesosalpinx and utero-ovarian ligament. After using similar pattern to dissect through broad ligament, the Harmonic was used to coagulate and cut the left uterine artery. This process took additional time as thick band of scar tissue caused fundus to be levo-rotated and was oozing requiring extra attention. The process was then repeated on right aspect. Upon completion, uterus was thoroughly blanched and good hemostasis was noted. Bilateral cardinal ligaments were then ligated using Harmonic scalpel allowing for adequate elevation using manipulator to expose area for colpotomy.    The colpotomy incision was made using harmonic and carried out circumferentially, taking care to amputate cervix from vagina without shortening the vaginal cuff. The uterus was then retracted into the vagina using the V-care.    After suction-irrigation revealed excellent vaginal cuff hemostasis, 0-vicryl Endostitch was used to closed the vaginal cuff in running non-locked fashion. Good hemostasis noted upon completion. Specimen passed off to  pathology; uterine corpus palpated quite spongy during removal. No gross evidence of injury to bladder or  bowel noted. Carter-Thompson fascial closure device used to close LLQ 26mm port site after trochar was removed using 0-vicryl suture.    At this time attention turned towards cystoscopy at which time IV fluoroscein was administered by anesthesia.  Foley catheter removed, routine cystoscopy was carried out. Bilateral efflux noted from ureters.  No suture evident within the bladder.  Bubble sign positive. Foley atheter replaced.   Remaining trochars were removed without issue and pneumoperitoneum was evacuated. Skin was closed using 4-0 Monocryl and Dermabond overlying.   The patient was transferred to recovery room in stable condition. All needle, sponge ad instrument counts were noted to be correct. x2 at end of procedure.

## 2019-12-09 NOTE — Discharge Summary (Signed)
Physician Discharge Summary  Patient ID: Nancy Reid MRN: [id number] DOB/AGE: 08-Jan-1976 44 y.o.  Admit date: 12/09/2019 Discharge date: 12/09/2019  Admission Diagnoses: AUB-HMB, dysmenorrhea, uterine fibroid  Discharge Diagnoses: same as above Active Problems:   Uterine fibroid   Discharged Condition: good  Hospital Course:  Admitted for scheduled TLH/BS/Cysto for AUB-HMB, dysmenorrhea. Uncomplicated procedure, please see operative note for full details. By POD#0 evening, ambulating, tolerating PO, voiding, pain controlled on PO meds. Discharged home in stable fashion with routine precautions   Consults: None  Discharge Exam: Blood pressure (!) 113/54, pulse 82, temperature 98.6 F (37 C), resp. rate 14, height 5\' 6"  (1.676 m), weight 108.8 kg, last menstrual period 10/09/2019, SpO2 98 %.  Gen: AAF appers tired but in NAD CV; RRR Abd: Soft, appropriately tender; BL LQ and umbilical incisions with intact Dermabond, no ecchymoses/induration/bleeding. +BS MSK: SCDs in place  Disposition: Discharge disposition: 01-Home or Self Care     Home  Discharge Instructions    Call MD for:  difficulty breathing, headache or visual disturbances   Complete by: As directed    Call MD for:  extreme fatigue   Complete by: As directed    Call MD for:  hives   Complete by: As directed    Call MD for:  persistant dizziness or light-headedness   Complete by: As directed    Call MD for:  persistant nausea and vomiting   Complete by: As directed    Call MD for:  redness, tenderness, or signs of infection (pain, swelling, redness, odor or green/yellow discharge around incision site)   Complete by: As directed    Call MD for:  severe uncontrolled pain   Complete by: As directed    Call MD for:  temperature >100.4   Complete by: As directed    Diet - low sodium heart healthy   Complete by: As directed    Increase activity slowly   Complete by: As directed    Lifting restrictions    Complete by: As directed    15lb   Sexual Activity Restrictions   Complete by: As directed    None for 6wks     Allergies as of 12/09/2019      Reactions   Sulfa Antibiotics Itching   Iodine Itching   Shellfish Allergy Itching, Swelling   Sulfamethoxazole-trimethoprim Itching      Medication List    STOP taking these medications   amoxicillin-clavulanate 875-125 MG tablet Commonly known as: AUGMENTIN   doxycycline 100 MG tablet Commonly known as: VIBRA-TABS   furosemide 40 MG tablet Commonly known as: LASIX   hydrochlorothiazide 25 MG tablet Commonly known as: HYDRODIURIL   Junel FE 1.5/30 1.5-30 MG-MCG tablet Generic drug: norethindrone-ethinyl estradiol-iron   Levonorgestrel-Ethinyl Estradiol 0.15-0.03 &0.01 MG tablet Commonly known as: AMETHIA   terbinafine 250 MG tablet Commonly known as: LamISIL     TAKE these medications   ibuprofen 800 MG tablet Commonly known as: ADVIL Take 1 tablet (800 mg total) by mouth every 8 (eight) hours. What changed:   when to take this  reasons to take this   ondansetron 4 MG tablet Commonly known as: ZOFRAN Take 1 tablet (4 mg total) by mouth every 6 (six) hours as needed for nausea.   oxyCODONE 5 MG immediate release tablet Commonly known as: Oxy IR/ROXICODONE Take 1 tablet (5 mg total) by mouth every 6 (six) hours as needed for severe pain or breakthrough pain.   simethicone 80 MG chewable tablet Commonly known as:  MYLICON Chew 1 tablet (80 mg total) by mouth 4 (four) times daily as needed for flatulence.   valACYclovir 500 MG tablet Commonly known as: VALTREX Take 500 mg by mouth daily.   Vitamin D (Ergocalciferol) 1.25 MG (50000 UNIT) Caps capsule Commonly known as: DRISDOL Take 50,000 Units by mouth once a week.       Follow-up Information    Dioselina Brumbaugh, Melida Quitter, MD Follow up in 2 week(s).   Specialty: Obstetrics and Gynecology Why: 2wk incision check plus 6wk postpartum Contact information: [place] [address] [phone]               Signed: Deliah Boston 12/09/2019, 5:16 PM

## 2019-12-09 NOTE — Progress Notes (Signed)
Patient seen at beside. Tolerating PO without N/V. Has ambulated, voided and belched multiple times. Feels like flatus will come soon. Denies vaginal bleeding. Pain 3/10 currently. Went over intraop course including adhesions to pelvic sidewall and that, thusly, may have lingering pain on that side during healing.  BP 112/63 (BP Location: Right Arm)   Pulse 75   Temp 97.9 F (36.6 C)   Resp 15   Ht '5\' 6"'  (1.676 m)   Wt 108.8 kg   LMP 10/09/2019 (Approximate)   SpO2 97%   BMI 38.72 kg/m   Gen: AAF appers tired but in NAD CV; RRR Abd: Soft, appropriately tender; BL LQ and umbilical incisions with intact Dermabond, no ecchymoses/induration/bleeding. +BS MSK: SCDs in place  Met milestones, patient desires same-day discharge. Reviewed pain control goals and postop restrictions. Will see in office in 2 weeks for incision check and 6wks for postop.

## 2019-12-09 NOTE — Anesthesia Procedure Notes (Signed)
Procedure Name: Intubation Date/Time: 12/09/2019 8:00 AM Performed by: Bonney Aid, CRNA Pre-anesthesia Checklist: Patient identified, Emergency Drugs available, Suction available and Patient being monitored Patient Re-evaluated:Patient Re-evaluated prior to induction Oxygen Delivery Method: Circle system utilized Preoxygenation: Pre-oxygenation with 100% oxygen Induction Type: IV induction Ventilation: Mask ventilation without difficulty Laryngoscope Size: Mac and 3 Grade View: Grade I Tube type: Oral Number of attempts: 1 Airway Equipment and Method: Stylet Placement Confirmation: ETT inserted through vocal cords under direct vision,  positive ETCO2 and breath sounds checked- equal and bilateral Secured at: 21 cm Tube secured with: Tape Dental Injury: Teeth and Oropharynx as per pre-operative assessment

## 2019-12-09 NOTE — Discharge Instructions (Signed)
Post Anesthesia Home Care Instructions  Activity: Get plenty of rest for the remainder of the day. A responsible individual must stay with you for 24 hours following the procedure.  For the next 24 hours, DO NOT: -Drive a car -Operate machinery -Drink alcoholic beverages -Take any medication unless instructed by your physician -Make any legal decisions or sign important papers.  Meals: Start with liquid foods such as gelatin or soup. Progress to regular foods as tolerated. Avoid greasy, spicy, heavy foods. If nausea and/or vomiting occur, drink only clear liquids until the nausea and/or vomiting subsides. Call your physician if vomiting continues.  Special Instructions/Symptoms: Your throat may feel dry or sore from the anesthesia or the breathing tube placed in your throat during surgery. If this causes discomfort, gargle with warm salt water. The discomfort should disappear within 24 hours.  If you had a scopolamine patch placed behind your ear for the management of post- operative nausea and/or vomiting:  1. The medication in the patch is effective for 72 hours, after which it should be removed.  Wrap patch in a tissue and discard in the trash. Wash hands thoroughly with soap and water. 2. You may remove the patch earlier than 72 hours if you experience unpleasant side effects which may include dry mouth, dizziness or visual disturbances. 3. Avoid touching the patch. Wash your hands with soap and water after contact with the patch.    Laparoscopically Assisted Vaginal Hysterectomy, Care After This sheet gives you information about how to care for yourself after your procedure. Your health care provider may also give you more specific instructions. If you have problems or questions, contact your health care provider. What can I expect after the procedure? After the procedure, it is common to have:  Soreness and numbness in your incision areas.  Abdominal pain. You will be given pain  medicine to control it.  Vaginal bleeding and discharge. You will need to use a sanitary napkin after this procedure.  Sore throat from the breathing tube that was inserted during surgery. Follow these instructions at home: Medicines  Take over-the-counter and prescription medicines only as told by your health care provider.  Do not take aspirin or ibuprofen. These medicines can cause bleeding.  Do not drive or use heavy machinery while taking prescription pain medicine.  Do not drive for 24 hours if you were given a medicine to help you relax (sedative) during the procedure. Incision care   Follow instructions from your health care provider about how to take care of your incisions. Make sure you: ? Wash your hands with soap and water before you change your bandage (dressing). If soap and water are not available, use hand sanitizer. ? Change your dressing as told by your health care provider. ? Leave stitches (sutures), skin glue, or adhesive strips in place. These skin closures may need to stay in place for 2 weeks or longer. If adhesive strip edges start to loosen and curl up, you may trim the loose edges. Do not remove adhesive strips completely unless your health care provider tells you to do that.  Check your incision area every day for signs of infection. Check for: ? Redness, swelling, or pain. ? Fluid or blood. ? Warmth. ? Pus or a bad smell. Activity  Get regular exercise as told by your health care provider. You may be told to take short walks every day and go farther each time.  Return to your normal activities as told by your health care   provider. Ask your health care provider what activities are safe for you.  Do not douche, use tampons, or have sexual intercourse for at least 6 weeks, or until your health care provider gives you permission.  Do not lift anything that is heavier than 10 lb (4.5 kg), or the limit that your health care provider tells you, until he or  she says that it is safe. General instructions  Do not take baths, swim, or use a hot tub until your health care provider approves. Take showers instead of baths.  Do not drive for 24 hours if you received a sedative.  Do not drive or operate heavy machinery while taking prescription pain medicine.  To prevent or treat constipation while you are taking prescription pain medicine, your health care provider may recommend that you: ? Drink enough fluid to keep your urine clear or pale yellow. ? Take over-the-counter or prescription medicines. ? Eat foods that are high in fiber, such as fresh fruits and vegetables, whole grains, and beans. ? Limit foods that are high in fat and processed sugars, such as fried and sweet foods.  Keep all follow-up visits as told by your health care provider. This is important. Contact a health care provider if:  You have signs of infection, such as: ? Redness, swelling, or pain around your incision sites. ? Fluid or blood coming from an incision. ? An incision that feels warm to the touch. ? Pus or a bad smell coming from an incision.  Your incision breaks open.  Your pain medicine is not helping.  You feel dizzy or light-headed.  You have pain or bleeding when you urinate.  You have persistent nausea and vomiting.  You have blood, pus, or a bad-smelling discharge from your vagina. Get help right away if:  You have a fever.  You have severe abdominal pain.  You have chest pain.  You have shortness of breath.  You faint.  You have pain, swelling, or redness in your leg.  You have heavy bleeding from your vagina. Summary  After the procedure, it is common to have abdominal pain and vaginal bleeding.  You should not drive or lift heavy objects until your health care provider says that it is safe.  Contact your health care provider if you have any symptoms of infection, excessive vaginal bleeding, nausea, vomiting, or shortness of  breath. This information is not intended to replace advice given to you by your health care provider. Make sure you discuss any questions you have with your health care provider. Document Revised: 04/04/2017 Document Reviewed: 06/18/2016 [employer] Patient Education  2020 [employer]  

## 2019-12-09 NOTE — Transfer of Care (Signed)
Immediate Anesthesia Transfer of Care Note  Patient: Nancy Reid  Procedure(s) Performed: TOTAL LAPAROSCOPIC HYSTERECTOMY WITH SALPINGECTOMY,LYSIS OF ADHESIONS (Bilateral Abdomen) CYSTOSCOPY (Bilateral Bladder)  Patient Location: PACU  Anesthesia Type:General  Level of Consciousness: sedated  Airway & Oxygen Therapy: Patient Spontanous Breathing and Patient connected to nasal cannula oxygen  Post-op Assessment: Report given to RN  Post vital signs: Reviewed and stable  Last Vitals:  Vitals Value Taken Time  BP 110/65 12/09/19 1134  Temp    Pulse 88 12/09/19 1136  Resp 21 12/09/19 1136  SpO2 98 % 12/09/19 1136  Vitals shown include unvalidated device data.  Last Pain:  Vitals:   12/09/19 0619  TempSrc: Oral  PainSc: 0-No pain         Complications: No complications documented.

## 2019-12-10 LAB — SURGICAL PATHOLOGY

## 2019-12-13 ENCOUNTER — Encounter (HOSPITAL_BASED_OUTPATIENT_CLINIC_OR_DEPARTMENT_OTHER): Payer: Self-pay | Admitting: Obstetrics and Gynecology

## 2020-03-02 ENCOUNTER — Ambulatory Visit: Payer: BC Managed Care – PPO | Admitting: Podiatry

## 2020-03-10 ENCOUNTER — Ambulatory Visit: Payer: BC Managed Care – PPO | Admitting: Podiatry

## 2020-04-09 ENCOUNTER — Ambulatory Visit (HOSPITAL_COMMUNITY)
Admission: EM | Admit: 2020-04-09 | Discharge: 2020-04-09 | Disposition: A | Discharge status: Home / Self Care | Payer: BC Managed Care – PPO | Attending: Emergency Medicine | Admitting: Emergency Medicine

## 2020-04-09 ENCOUNTER — Other Ambulatory Visit: Payer: Self-pay

## 2020-04-09 ENCOUNTER — Encounter (HOSPITAL_COMMUNITY): Payer: Self-pay | Admitting: *Deleted

## 2020-04-09 DIAGNOSIS — M722 Plantar fascial fibromatosis: Secondary | ICD-10-CM

## 2020-04-09 DIAGNOSIS — M25532 Pain in left wrist: Secondary | ICD-10-CM

## 2020-04-09 MED ORDER — NAPROXEN 500 MG PO TABS
500.0000 mg | ORAL_TABLET | Freq: Two times a day (BID) | ORAL | 0 refills | Status: DC | PRN
Start: 2020-04-09 — End: 2022-03-18

## 2020-04-09 MED ORDER — PREDNISONE 10 MG (21) PO TBPK
ORAL_TABLET | Freq: Every day | ORAL | 0 refills | Status: DC
Start: 2020-04-09 — End: 2020-07-04

## 2020-04-09 NOTE — ED Provider Notes (Signed)
[place]    CSN: [id number] Arrival date & time: 04/09/20  1039      History   Chief Complaint Chief Complaint  Patient presents with  . Foot Pain  . Hand Pain    HPI Kaydyn Chism is a 44 y.o. female.   Gerre Ranum presents with complaints of 1 week of right heel pain, worse when she wakes up or after sitting for a prolonged period of time. Two weeks of left wrist pain, worse with rotation. No numbness or tingling to her hand. Ibuprofen has helped some. No known injury to either location. Works at [employer]. 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 Does have young grandchildren she lifts frequently. Has had similar issues in the past, has had injection to left wrist years ago with resolutions of symptoms.  ROS per HPI, negative if not otherwise mentioned.       Past Medical History:  Diagnosis Date  . Anginal pain ([place]) 2019  . Family history of adverse reaction to anesthesia    N&V  . History of kidney stones     Patient Active Problem List   Diagnosis Date Noted  . Uterine fibroid 12/09/2019  . Chronic venous insufficiency 06/22/2018  . Morbid obesity ([place]) 06/22/2018  . Peripheral edema 06/22/2018  . Dyspnea on exertion 12/10/2017  . Leg swelling 12/10/2017    Past Surgical History:  Procedure Laterality Date  . ABDOMINAL HYSTERECTOMY    . BREAST SURGERY Bilateral 2014   reduction  . CESAREAN SECTION     x2  . CYSTOSCOPY Bilateral 12/09/2019   Procedure: CYSTOSCOPY;  Surgeon: Deliah Boston, MD;  Location: [facility];  Service: Gynecology;  Laterality: Bilateral;  . HERNIA REPAIR     umbilical  . KNEE SURGERY Left    age 85  . TONSILLECTOMY    . TOTAL LAPAROSCOPIC HYSTERECTOMY WITH SALPINGECTOMY Bilateral 12/09/2019   Procedure: TOTAL LAPAROSCOPIC HYSTERECTOMY WITH SALPINGECTOMY,LYSIS OF ADHESIONS;  Surgeon: Deliah Boston, MD;  Location: [facility];  Service: Gynecology;   Laterality: Bilateral;    OB History   No obstetric history on file.      Home Medications    Prior to Admission medications   Medication Sig Start Date End Date Taking? Authorizing Provider  valACYclovir (VALTREX) 500 MG tablet Take 500 mg by mouth daily.  05/28/19  Yes [provider]  Vitamin D, Ergocalciferol, (DRISDOL) 1.25 MG (50000 UNIT) CAPS capsule Take 50,000 Units by mouth once a week. 08/27/19  Yes [provider]  ibuprofen (ADVIL) 800 MG tablet Take 1 tablet (800 mg total) by mouth every 8 (eight) hours. 12/09/19   Shivaji, Melida Quitter, MD  naproxen (NAPROSYN) 500 MG tablet Take 1 tablet (500 mg total) by mouth 2 (two) times daily as needed. 04/09/20   Zigmund Gottron, NP  ondansetron (ZOFRAN) 4 MG tablet Take 1 tablet (4 mg total) by mouth every 6 (six) hours as needed for nausea. 12/09/19   Shivaji, Melida Quitter, MD  oxyCODONE (OXY IR/ROXICODONE) 5 MG immediate release tablet Take 1 tablet (5 mg total) by mouth every 6 (six) hours as needed for severe pain or breakthrough pain. 12/09/19   Shivaji, Melida Quitter, MD  predniSONE (STERAPRED UNI-PAK 21 TAB) 10 MG (21) TBPK tablet Take by mouth daily. Per box instruction 04/09/20   Zigmund Gottron, NP  simethicone (MYLICON) 80 MG chewable tablet Chew 1 tablet (80 mg total) by mouth 4 (four)  times daily as needed for flatulence. 12/09/19   Shivaji, Melida Quitter, MD    Family History Family History  Problem Relation Age of Onset  . Healthy Mother   . Healthy Father     Social History Social History   Tobacco Use  . Smoking status: Never Smoker  . Smokeless tobacco: Never Used  Vaping Use  . Vaping Use: Never used  Substance Use Topics  . Alcohol use: Yes    Comment: social  . Drug use: Never     Allergies   Sulfa antibiotics, Iodine, Shellfish allergy, and Sulfamethoxazole-trimethoprim   Review of Systems Review of Systems   Physical Exam Triage Vital Signs ED Triage Vitals  Enc Vitals Group     BP 04/09/20  1207 117/72     Pulse Rate 04/09/20 1207 80     Resp 04/09/20 1207 18     Temp 04/09/20 1207 97.6 F (36.4 C)     Temp Source 04/09/20 1207 Oral     SpO2 04/09/20 1207 100 %     Weight --      Height --      Head Circumference --      Peak Flow --      Pain Score 04/09/20 1206 4     Pain Loc --      Pain Edu? --      Excl. in [place]? --    No data found.  Updated Vital Signs BP 117/72   Pulse 80   Temp 97.6 F (36.4 C) (Oral)   Resp 18   LMP 11/01/2019   SpO2 100%    Physical Exam Constitutional:      General: She is not in acute distress.    Appearance: She is well-developed.  Cardiovascular:     Rate and Rhythm: Normal rate.  Pulmonary:     Effort: Pulmonary effort is normal.  Musculoskeletal:     Right foot: Normal range of motion. Tenderness present. No swelling or deformity.     Comments: Heel with tenderness at plantar fascial insertion; some posterior heel tenderness at well; full ROM of right foot and ankle; left wrist tenderness at distal radius, positive finkelstein; full ROM to left wrist without redness swelling or warmth; no numbness or tingling sensation  Skin:    General: Skin is warm and dry.  Neurological:     Mental Status: She is alert and oriented to person, place, and time.      UC Treatments / Results  Labs (all labs ordered are listed, but only abnormal results are displayed) Labs Reviewed - No data to display  EKG   Radiology No results found.  Procedures Procedures (including critical care time)  Medications Ordered in UC Medications - No data to display  Initial Impression / Assessment and Plan / UC Course  I have reviewed the triage vital signs and the nursing notes.  Pertinent labs & imaging results that were available during my care of the patient were reviewed by me and considered in my medical decision making (see chart for details).     Plantar fasciitis likely to right foot/ heel with pain management discussed and  follow up recommendations provided- follows already with podiatry related to a toe nail issue; thumb spica with concern for de quervain's to left wrist. Follow up recommendations provided. Patient verbalized understanding and agreeable to plan.   Final Clinical Impressions(s) / UC Diagnoses   Final diagnoses:  Plantar fasciitis, right  Left wrist pain  Discharge Instructions     Complete steroid pack, I feel it should help with both areas of pain.  Once complete you may use anti inflammatories as needed for pain such as naproxen twice a day, or ibuprofen 800mg  every 8 hours, with food.  See provided information and exercises for your foot.  Wear supportive footwear at all times, limit barefoot time even.  Follow up with podiatry as needed if persistent or worsening.  Use of brace to left wrist while active to help with pain. Apply ice after increased use.  Follow up with orthopedics as needed if persistent as you may need further treatment.     ED Prescriptions    Medication Sig Dispense Auth. Provider   predniSONE (STERAPRED UNI-PAK 21 TAB) 10 MG (21) TBPK tablet Take by mouth daily. Per box instruction 21 tablet Myrtis Maille B, NP   naproxen (NAPROSYN) 500 MG tablet Take 1 tablet (500 mg total) by mouth 2 (two) times daily as needed. 30 tablet Zigmund Gottron, NP     PDMP not reviewed this encounter.   Zigmund Gottron, NP 04/09/20 1440

## 2020-04-09 NOTE — Discharge Instructions (Signed)
Complete steroid pack, I feel it should help with both areas of pain.  Once complete you may use anti inflammatories as needed for pain such as naproxen twice a day, or ibuprofen 800mg  every 8 hours, with food.  See provided information and exercises for your foot.  Wear supportive footwear at all times, limit barefoot time even.  Follow up with podiatry as needed if persistent or worsening.  Use of brace to left wrist while active to help with pain. Apply ice after increased use.  Follow up with orthopedics as needed if persistent as you may need further treatment.

## 2020-04-09 NOTE — ED Triage Notes (Signed)
C/O right foot pain and left hand "stiffness" x 2 wks without any known injuries.  CMS intact to RLE and LUE.

## 2020-05-12 ENCOUNTER — Ambulatory Visit: Payer: BC Managed Care – PPO | Admitting: Podiatry

## 2020-05-12 ENCOUNTER — Ambulatory Visit (INDEPENDENT_AMBULATORY_CARE_PROVIDER_SITE_OTHER): Payer: BC Managed Care – PPO

## 2020-05-12 ENCOUNTER — Other Ambulatory Visit: Payer: Self-pay

## 2020-05-12 ENCOUNTER — Encounter: Payer: Self-pay | Admitting: Podiatry

## 2020-05-12 DIAGNOSIS — M722 Plantar fascial fibromatosis: Secondary | ICD-10-CM | POA: Diagnosis not present

## 2020-05-12 MED ORDER — DICLOFENAC SODIUM 75 MG PO TBEC: 75.0000 mg | DELAYED_RELEASE_TABLET | Freq: Two times a day (BID) | ORAL | 2 refills | Status: DC

## 2020-05-12 NOTE — Patient Instructions (Signed)

## 2020-05-15 NOTE — Progress Notes (Signed)
Subjective:   Patient ID: Nancy Reid, female   DOB: 45 y.o.   MRN: [id number]   HPI Patient states her big toe joint has been feeling good but she has developed a lot of pain in the bottom of her right heel in the last month.  Does not remember specific injury and states that it is gotten worse over that time   ROS      Objective:  Physical Exam  Neurovascular status intact with inflammation pain of the right plantar fascia at the insertional point of the tendon into the calcaneus with fluid buildup around the insertion     Assessment:  Acute plantar fasciitis right inflammation fluid around the medial band     Plan:  H&P reviewed condition and at this point did sterile prep and injected the plantar fascial right 3 mg Kenalog 5 mg Xylocaine and I applied fascial brace with instructions on usage.  Gave advice for shoe gear modifications and stretching exercises and reappoint to recheck if symptoms indicate  X-rays indicated spur no indications of stress fracture advanced arthritis

## 2020-07-04 ENCOUNTER — Encounter (HOSPITAL_COMMUNITY): Payer: Self-pay

## 2020-07-04 ENCOUNTER — Other Ambulatory Visit: Payer: Self-pay

## 2020-07-04 ENCOUNTER — Ambulatory Visit (HOSPITAL_COMMUNITY)
Admission: RE | Admit: 2020-07-04 | Discharge: 2020-07-04 | Disposition: A | Discharge status: Home / Self Care | Payer: BC Managed Care – PPO | Source: Ambulatory Visit | Attending: Internal Medicine | Admitting: Internal Medicine

## 2020-07-04 VITALS — BP 119/68 | HR 87 | Temp 98.8°F | Resp 17

## 2020-07-04 DIAGNOSIS — G44221 Chronic tension-type headache, intractable: Secondary | ICD-10-CM

## 2020-07-04 NOTE — ED Triage Notes (Signed)
Pt presents with recurring headache X 1 month.

## 2020-07-04 NOTE — Discharge Instructions (Addendum)
Please follow-up with your primary care physician to arrange for outpatient CT scan of the brain Return to the emergency department if your symptoms worsen Continue taking Tylenol and or Motrin for headaches.

## 2020-07-04 NOTE — ED Provider Notes (Signed)
[place]    CSN: [id number] Arrival date & time: 07/04/20  1655      History   Chief Complaint Chief Complaint  Patient presents with  . APPOINTMENT: Headache    HPI Nancy Reid is a 45 y.o. female comes to the urgent care with 1 month history of recurrent headaches.  Patient describes the headache as throbbing and starts in the frontal area.  Headache then worsens to include the left side of the head.  No floaters.  Tylenol sometimes helps the headaches.  Sleep also sometimes helps reduce the headaches.  She denies any nausea or vomiting.  Headache is associated with twitching around the left eye and some numbness on the left cheek.  Patient is requesting CT scan of the brain to further evaluate the headache.   This time the headache has become a daily occurrence.  HPI  Past Medical History:  Diagnosis Date  . Anginal pain ([place]) 2019  . Family history of adverse reaction to anesthesia    N&V  . History of kidney stones     Patient Active Problem List   Diagnosis Date Noted  . Uterine fibroid 12/09/2019  . Chronic venous insufficiency 06/22/2018  . Morbid obesity ([place]) 06/22/2018  . Peripheral edema 06/22/2018  . Dyspnea on exertion 12/10/2017  . Leg swelling 12/10/2017    Past Surgical History:  Procedure Laterality Date  . ABDOMINAL HYSTERECTOMY    . BREAST SURGERY Bilateral 2014   reduction  . CESAREAN SECTION     x2  . CYSTOSCOPY Bilateral 12/09/2019   Procedure: CYSTOSCOPY;  Surgeon: Deliah Boston, MD;  Location: [facility];  Service: Gynecology;  Laterality: Bilateral;  . HERNIA REPAIR     umbilical  . KNEE SURGERY Left    age 34  . TONSILLECTOMY    . TOTAL LAPAROSCOPIC HYSTERECTOMY WITH SALPINGECTOMY Bilateral 12/09/2019   Procedure: TOTAL LAPAROSCOPIC HYSTERECTOMY WITH SALPINGECTOMY,LYSIS OF ADHESIONS;  Surgeon: Deliah Boston, MD;  Location: [place];  Service: Gynecology;  Laterality: Bilateral;     OB History   No obstetric history on file.      Home Medications    Prior to Admission medications   Medication Sig Start Date End Date Taking? Authorizing Provider  diclofenac (VOLTAREN) 75 MG EC tablet Take 1 tablet (75 mg total) by mouth 2 (two) times daily. 05/12/20   Wallene Huh, DPM  ibuprofen (ADVIL) 800 MG tablet Take 1 tablet (800 mg total) by mouth every 8 (eight) hours. 12/09/19   Shivaji, Melida Quitter, MD  naproxen (NAPROSYN) 500 MG tablet Take 1 tablet (500 mg total) by mouth 2 (two) times daily as needed. 04/09/20   Zigmund Gottron, NP  ondansetron (ZOFRAN) 4 MG tablet Take 1 tablet (4 mg total) by mouth every 6 (six) hours as needed for nausea. 12/09/19   Shivaji, Melida Quitter, MD  oxyCODONE (OXY IR/ROXICODONE) 5 MG immediate release tablet Take 1 tablet (5 mg total) by mouth every 6 (six) hours as needed for severe pain or breakthrough pain. 12/09/19   Shivaji, Melida Quitter, MD  simethicone (MYLICON) 80 MG chewable tablet Chew 1 tablet (80 mg total) by mouth 4 (four) times daily as needed for flatulence. 12/09/19   Shivaji, Melida Quitter, MD  valACYclovir (VALTREX) 500 MG tablet Take 500 mg by mouth daily.  05/28/19   [provider]  Vitamin D, Ergocalciferol, (DRISDOL) 1.25 MG (50000 UNIT) CAPS capsule Take 50,000 Units by mouth once a week.  08/27/19   [provider]    Family History Family History  Problem Relation Age of Onset  . Healthy Mother   . Healthy Father     Social History Social History   Tobacco Use  . Smoking status: Never Smoker  . Smokeless tobacco: Never Used  Vaping Use  . Vaping Use: Never used  Substance Use Topics  . Alcohol use: Yes    Comment: social  . Drug use: Never     Allergies   Sulfa antibiotics, Iodine, Shellfish allergy, and Sulfamethoxazole-trimethoprim   Review of Systems Review of Systems  Respiratory: Negative.   Cardiovascular: Negative.   Gastrointestinal: Negative.   Neurological: Positive for numbness and  headaches. Negative for dizziness and weakness.     Physical Exam Triage Vital Signs ED Triage Vitals  Enc Vitals Group     BP 07/04/20 1717 119/68     Pulse Rate 07/04/20 1717 87     Resp 07/04/20 1717 17     Temp 07/04/20 1717 98.8 F (37.1 C)     Temp Source 07/04/20 1717 Oral     SpO2 07/04/20 1717 97 %     Weight --      Height --      Head Circumference --      Peak Flow --      Pain Score 07/04/20 1715 5     Pain Loc --      Pain Edu? --      Excl. in [place]? --    No data found.  Updated Vital Signs BP 119/68 (BP Location: Left Arm)   Pulse 87   Temp 98.8 F (37.1 C) (Oral)   Resp 17   LMP 11/01/2019   SpO2 97%   Visual Acuity Right Eye Distance:   Left Eye Distance:   Bilateral Distance:    Right Eye Near:   Left Eye Near:    Bilateral Near:     Physical Exam Vitals and nursing note reviewed.  Constitutional:      General: She is not in acute distress.    Appearance: She is not ill-appearing.  Skin:    General: Skin is warm.  Neurological:     Mental Status: She is alert and oriented to person, place, and time. Mental status is at baseline.      UC Treatments / Results  Labs (all labs ordered are listed, but only abnormal results are displayed) Labs Reviewed - No data to display  EKG   Radiology No results found.  Procedures Procedures (including critical care time)  Medications Ordered in UC Medications - No data to display  Initial Impression / Assessment and Plan / UC Course  I have reviewed the triage vital signs and the nursing notes.  Pertinent labs & imaging results that were available during my care of the patient were reviewed by me and considered in my medical decision making (see chart for details).     1.  Tension type headache with daily occurrence: Patient reason for coming here is to get an order for CT scan of the brain.  I explained to the patient that we do not have CT scanning resources in the urgent care.  I  further explained that she can get a CT scan through her primary care physician in the outpatient setting.  Patient was not very pleased about the fact that we did not have CT scan services in the urgent care.  Patient is advised to follow-up with primary  care physician for further management.  I offered referral to neurologist but she felt that the time to be seen was too long.  She will follow up with primary care physician. Final Clinical Impressions(s) / UC Diagnoses   Final diagnoses:  Chronic tension-type headache, intractable     Discharge Instructions     Please follow-up with your primary care physician to arrange for outpatient CT scan of the brain Return to the emergency department if your symptoms worsen Continue taking Tylenol and or Motrin for headaches.   ED Prescriptions    None     PDMP not reviewed this encounter.   Chase Picket, MD 07/04/20 Drema Halon

## 2020-07-22 ENCOUNTER — Other Ambulatory Visit: Payer: Self-pay | Admitting: Podiatry

## 2020-07-24 NOTE — Telephone Encounter (Signed)
Please advise 

## 2021-05-12 ENCOUNTER — Encounter (HOSPITAL_COMMUNITY): Payer: Self-pay

## 2021-05-12 ENCOUNTER — Emergency Department (HOSPITAL_COMMUNITY)
Admission: EM | Admit: 2021-05-12 | Discharge: 2021-05-12 | Discharge status: Against Medical Advice | Payer: BC Managed Care – PPO | Attending: Emergency Medicine | Admitting: Emergency Medicine

## 2021-05-12 ENCOUNTER — Other Ambulatory Visit: Payer: Self-pay

## 2021-05-12 ENCOUNTER — Emergency Department (HOSPITAL_COMMUNITY): Payer: BC Managed Care – PPO

## 2021-05-12 DIAGNOSIS — R079 Chest pain, unspecified: Secondary | ICD-10-CM | POA: Diagnosis not present

## 2021-05-12 DIAGNOSIS — M25532 Pain in left wrist: Secondary | ICD-10-CM | POA: Insufficient documentation

## 2021-05-12 DIAGNOSIS — Z5321 Procedure and treatment not carried out due to patient leaving prior to being seen by health care provider: Secondary | ICD-10-CM | POA: Diagnosis not present

## 2021-05-12 LAB — BASIC METABOLIC PANEL
Anion gap: 8 (ref 5–15)
BUN: 12 mg/dL (ref 6–20)
CO2: 26 mmol/L (ref 22–32)
Calcium: 9 mg/dL (ref 8.9–10.3)
Chloride: 103 mmol/L (ref 98–111)
Creatinine, Ser: 1.06 mg/dL — ABNORMAL HIGH (ref 0.44–1.00)
GFR, Estimated: >60 mL/min (ref >60)
Glucose, Bld: 117 mg/dL — ABNORMAL HIGH (ref 70–99)
Potassium: 3.7 mmol/L (ref 3.5–5.1)
Sodium: 137 mmol/L (ref 135–145)

## 2021-05-12 LAB — CBC
HCT: 40.3 % (ref 36.0–46.0)
Hemoglobin: 13.5 g/dL (ref 12.0–15.0)
MCH: 31.1 pg (ref 26.0–34.0)
MCHC: 33.5 g/dL (ref 30.0–36.0)
MCV: 92.9 fL (ref 80.0–100.0)
Platelets: 248 10*3/uL (ref 150–400)
RBC: 4.34 MIL/uL (ref 3.87–5.11)
RDW: 12.4 % (ref 11.5–15.5)
WBC: 8.6 10*3/uL (ref 4.0–10.5)
nRBC: 0 % (ref 0.0–0.2)

## 2021-05-12 LAB — I-STAT BETA HCG BLOOD, ED (MC, WL, AP ONLY): I-stat hCG, quantitative: <5 m[IU]/mL (ref <5)

## 2021-05-12 LAB — TROPONIN I (HIGH SENSITIVITY): Troponin I (High Sensitivity): 3 ng/L (ref <18)

## 2021-05-12 NOTE — ED Provider Triage Note (Signed)
Emergency Medicine Provider Triage Evaluation Note  Nancy Reid , a 46 y.o. female  was evaluated in triage.  Pt complains of chest pain. She states that same has been occurring intermittently for the past week. Episodes occur without discernible trigger and last about 2 to 3 minutes, she states they are getting more frequent.  Pain is aching in nature and radiates down her left arm.  No history of heart problems.  No shortness of breath.  Review of Systems  Positive: Chest pain Negative: SOB, fevers, chills, n/v/d  Physical Exam  BP 128/71 (BP Location: Right Arm)    Pulse (!) 101    Temp 99.1 F (37.3 C) (Oral)    Resp 16    Ht 5\' 6"  (1.676 m)    Wt 108.4 kg    LMP 11/01/2019    SpO2 98%    BMI 38.58 kg/m  Gen:   Awake, no distress   Resp:  Normal effort  MSK:   Moves extremities without difficulty  Other:   Medical Decision Making  Medically screening exam initiated at 4:03 PM.  Appropriate orders placed.  Nancy Reid was informed that the remainder of the evaluation will be completed by another provider, this initial triage assessment does not replace that evaluation, and the importance of remaining in the ED until their evaluation is complete.     Nancy Face, PA-C 05/12/21 1606

## 2021-05-12 NOTE — ED Triage Notes (Signed)
Pt arrived via POV for c/o 5/10 intermittent throbbing left sided chest pain that radiates to left wristx1 wk. Pt states pain got worse today. Pt denies N/V, SOB.

## 2021-05-12 NOTE — ED Notes (Signed)
This RN made aware that patient left AMA. This RN did not talk to patient, nor did MD, about AMA procedure.

## 2021-05-16 ENCOUNTER — Other Ambulatory Visit: Payer: Self-pay | Admitting: Family Medicine

## 2021-05-16 DIAGNOSIS — R202 Paresthesia of skin: Secondary | ICD-10-CM

## 2021-05-23 ENCOUNTER — Ambulatory Visit
Admission: RE | Admit: 2021-05-23 | Discharge: 2021-05-23 | Disposition: A | Discharge status: Home / Self Care | Payer: BC Managed Care – PPO | Source: Ambulatory Visit | Attending: Family Medicine | Admitting: Family Medicine

## 2021-05-23 ENCOUNTER — Other Ambulatory Visit: Payer: Self-pay

## 2021-05-23 DIAGNOSIS — R202 Paresthesia of skin: Secondary | ICD-10-CM

## 2021-05-23 MED ORDER — GADOBENATE DIMEGLUMINE 529 MG/ML IV SOLN
20.0000 mL | Freq: Once | INTRAVENOUS | Status: AC | PRN
  Administered 2021-05-23: 20 mL via INTRAVENOUS

## 2021-07-30 ENCOUNTER — Ambulatory Visit: Payer: BC Managed Care – PPO | Admitting: Podiatry

## 2021-07-30 ENCOUNTER — Other Ambulatory Visit: Payer: Self-pay

## 2021-07-30 DIAGNOSIS — B351 Tinea unguium: Secondary | ICD-10-CM | POA: Diagnosis not present

## 2021-07-30 DIAGNOSIS — L600 Ingrowing nail: Secondary | ICD-10-CM | POA: Diagnosis not present

## 2021-07-30 MED ORDER — TERBINAFINE HCL 250 MG PO TABS: ORAL_TABLET | ORAL | 0 refills | Status: DC

## 2021-08-01 NOTE — Progress Notes (Signed)
Subjective:  ? ?Patient ID: Nancy Reid, female   DOB: 46 y.o.   MRN: [id number]  ? ?HPI ?Patient presents concerned about significant discoloration of the right hallux nail stating that this is started to occur recently does not remember injury but has had issues with this nail in the past ? ? ?ROS ? ? ?   ?Objective:  ?Physical Exam  ?Neurovascular status intact with patient found to have a discolored right hallux nail that is thickened with no current discomfort no drainage or other pathology ? ?   ?Assessment:  ?Probability that this is a damaged toenail versus fungal infiltration but with the discoloration pattern probability for secondary fungus ? ?   ?Plan:  ?H&P reviewed condition and I do think a low course of antifungal therapy would be of benefit and I went ahead and I did a pulse antifungal treatment.  I also discussed other possibilities for the future and we will see how he responds to this with ultimate nail removal if it were to become painful or loose ?   ? ? ?

## 2021-08-06 ENCOUNTER — Other Ambulatory Visit: Payer: Self-pay | Admitting: Podiatry

## 2021-08-08 NOTE — Telephone Encounter (Signed)
Please advice  

## 2021-10-26 ENCOUNTER — Encounter: Payer: Self-pay | Admitting: Neurology

## 2021-11-08 ENCOUNTER — Telehealth: Payer: Self-pay | Admitting: Podiatry

## 2021-11-08 NOTE — Telephone Encounter (Signed)
Pt called stating she is unable to receive her medication due to it needing a prior authorization.   Prior authorization needed for: terbinafine (LAMISIL) 250 MG tablet  Please advise.

## 2021-11-08 NOTE — Telephone Encounter (Signed)
This is generic and should only cost 10$ for prescription

## 2021-12-04 ENCOUNTER — Telehealth: Payer: Self-pay

## 2021-12-04 NOTE — Telephone Encounter (Signed)
terbinafine (LAMISIL) 250 MG tablet     Prior Authorization was started again today on 12/04/21.

## 2022-01-10 NOTE — Progress Notes (Signed)
Initial neurology clinic note  Chrishelle Zito MRN: [id number] DOB: 05-Apr-1976  Referring provider: Jacqualine Code, DO  Primary care provider: Jacqualine Code, DO  Reason for consult:  paresthesias of multiple locations  Subjective:  This is Ms. Nancy Reid, a 46 y.o. right-handed female with a medical history of vitamin D deficiency, low back pain, and obesity who presents to neurology clinic with paresthesias of multiple locations. The patient is alone today.  Patient has numbness on the left side of her face, like her face is asleep. It has been going on since about 04/2021. It comes and goes. At first, it was once daily. Then it slowed down. She now can touch her left face and feel like the sensation is coming back. The numbness now only happens once a week. She denies any facial drooping or facial weakness. She may have a viral like illness earlier in the month (04/2022). She denies skin rash or hiking or anything concerning for a tick bite. She denies significant headaches currently, but did have some headaches around 03/2021-04/2021. She was given Roselyn Meier and took a couple of doses. This resolved her headaches.  She went to her PCP who ordered an MRI brain w/wo contrast. It was essentially normal but there was some sinus issues seen. She saw ENT who did not feel patient needed intervention.  She went to a chiropractor who had back and neck adjustment. She had dizziness afterward. She was dizzy. She went back to the chiropractor who told her that her left eye "was behind". She cannot remember what she was told. She denies significant neck pain, but did have some there. After a few adjustments and shock therapy, she did get relief of upper back pain.  Patient has numbness/tingling that radiates down her right hip and leg. It is a pins and needles sensation in her leg that can wake her up. It is more on the side of her right thigh. It prevents her from wearing jeans or anything  tight on that leg. Her back has a burning sensation. She has no symptoms in her left leg. This has been going on for about 2.5 months. She saw her PCP who said she had a muscle spasm. She was given muscle relaxers which helped but caused her to be too out of it so only took 2 doses.   She endorses weight gain. She does not feel like she can work out because her legs and hands swell. She has seen rheumatology before but is re-establishing care next month. She has seen cardiology and not felt to have cardiac problems.  Patient also has pain in right hand that radiates up into arm. She points at the base of the third digit as the source of the pain. This has been present since 06/2021. She went to a hand specialist. She received cortisone shot in her right palm (~08/2021). She has not had good range of motion in her hands. A 3rd digit splint was given to her that she still wears. She drops things occasionally.  MEDICATIONS:  Outpatient Encounter Medications as of 01/17/2022  Medication Sig   diclofenac (VOLTAREN) 75 MG EC tablet TAKE 1 TABLET BY MOUTH TWICE A DAY   ibuprofen (ADVIL) 800 MG tablet Take 1 tablet (800 mg total) by mouth every 8 (eight) hours. (Patient not taking: Reported on 01/17/2022)   naproxen (NAPROSYN) 500 MG tablet Take 1 tablet (500 mg total) by mouth 2 (two) times daily as needed.   ondansetron (ZOFRAN) 4  MG tablet Take 1 tablet (4 mg total) by mouth every 6 (six) hours as needed for nausea.   oxyCODONE (OXY IR/ROXICODONE) 5 MG immediate release tablet Take 1 tablet (5 mg total) by mouth every 6 (six) hours as needed for severe pain or breakthrough pain.   simethicone (MYLICON) 80 MG chewable tablet Chew 1 tablet (80 mg total) by mouth 4 (four) times daily as needed for flatulence.   terbinafine (LAMISIL) 250 MG tablet Please take one a day x 7days, repeat every 4 weeks x 4 months   valACYclovir (VALTREX) 500 MG tablet Take 500 mg by mouth daily.    Vitamin D, Ergocalciferol,  (DRISDOL) 1.25 MG (50000 UNIT) CAPS capsule Take 50,000 Units by mouth once a week.   No facility-administered encounter medications on file as of 01/17/2022.    PAST MEDICAL HISTORY: Past Medical History:  Diagnosis Date   Anginal pain ([place]) 2019   Family history of adverse reaction to anesthesia    N&V   History of kidney stones     PAST SURGICAL HISTORY: Past Surgical History:  Procedure Laterality Date   ABDOMINAL HYSTERECTOMY     BREAST SURGERY Bilateral 2014   reduction   CESAREAN SECTION     x2   CYSTOSCOPY Bilateral 12/09/2019   Procedure: CYSTOSCOPY;  Surgeon: Deliah Boston, MD;  Location: [place];  Service: Gynecology;  Laterality: Bilateral;   HERNIA REPAIR     umbilical   KNEE SURGERY Left    age 74   TONSILLECTOMY     TOTAL LAPAROSCOPIC HYSTERECTOMY WITH SALPINGECTOMY Bilateral 12/09/2019   Procedure: TOTAL LAPAROSCOPIC HYSTERECTOMY WITH SALPINGECTOMY,LYSIS OF ADHESIONS;  Surgeon: Deliah Boston, MD;  Location: [place];  Service: Gynecology;  Laterality: Bilateral;    ALLERGIES: Allergies  Allergen Reactions   Sulfa Antibiotics Itching   Iodine Itching   Shellfish Allergy Itching and Swelling   Sulfamethoxazole-Trimethoprim Itching    FAMILY HISTORY: Family History  Problem Relation Age of Onset   Healthy Mother    Healthy Father     SOCIAL HISTORY: Social History   Tobacco Use   Smoking status: Never   Smokeless tobacco: Never  Vaping Use   Vaping Use: Never used  Substance Use Topics   Alcohol use: Yes   Drug use: Never   Social History   Social History Narrative   Right handed   Caffeine 4 cups daily   Live two level home with husband     Objective:  Vital Signs:  BP 130/80 (BP Location: Left Arm, Patient Position: Sitting, Cuff Size: Normal)   Pulse 75   Ht '5\' 6"'$  (1.676 m)   Wt 256 lb (116.1 kg)   LMP 10/09/2019 (Approximate)   SpO2 98%   BMI 41.32 kg/m   General: General  appearance: Awake and alert. No distress. Cooperative with exam.  Skin: No obvious rash or jaundice. HEENT: Atraumatic. Anicteric. Lungs: Non-labored breathing on room air  Extremities: Bilateral peripheral edema in lower extremities. No obvious deformity.  Musculoskeletal: No obvious joint swelling. Psych: Affect appropriate.  Neurological: Mental Status: Alert. Speech fluent. No pseudobulbar affect Cranial Nerves: CNII: No RAPD. Visual fields intact. CNIII, IV, VI: PERRL. No nystagmus. EOMI. Choppy pursuit. Feels dizzy when moving eyes side to side. Same symptoms when testing head impulse, also with choppy pursuit. CN V: Facial sensation intact bilaterally to fine touch and pin prick. [place] clench strong. CN VII: Facial muscles symmetric and strong. No ptosis at rest. CN VIII:  Hears finger rub well bilaterally. CN IX: No hypophonia. CN X: Palate elevates symmetrically. CN XI: Full strength shoulder shrug bilaterally. CN XII: Tongue protrusion full and midline. No atrophy or fasciculations. No significant dysarthria Motor: Tone is normal. No fasciculations in extremities. No atrophy.  Individual muscle group testing (MRC grade out of 5):  Movement     Neck flexion 5    Neck extension 5     Right Left   Shoulder abduction 5 5   Shoulder adduction 5 5   Shoulder ext rotation 5 5   Shoulder int rotation 5 5   Elbow flexion 5 5   Elbow extension 5 5   Wrist extension 5 5   Wrist flexion 5 5   Finger abduction - FDI 5 5   Finger abduction - ADM 5 5   Finger extension 5 5   Finger distal flexion - 2/'3 5 5   '$ Finger distal flexion - 4/'5 5 5   '$ Thumb flexion - FPL 5 5   Thumb abduction - APB 5 5    Hip flexion 5 5   Hip extension 5 5   Hip adduction 5 5   Hip abduction 5 5   Knee extension 5 5   Knee flexion 5 5   Dorsiflexion 5 5   Plantarflexion 5 5   Inversion 5 5   Eversion 5 5   Great toe extension 5 5   Great toe flexion 5 5     Reflexes:  Right Left    Bicep 2+ 2+   Tricep 2+ 2+   BrRad 2+ 2+   Knee 2+ 2+   Ankle 2+ 2+    Pathological Reflexes: Babinski: flexor response bilaterally Hoffman: absent bilaterally Troemner: absent bilaterally  Sensation:  Pinprick: Intact in bilateral upper extremities. Diminished over right lateral thigh, otherwise intact in bilateral lower extremities. Vibration: Intact in all extremities Proprioception: Intact in bilateral great toes Coordination: Intact finger-to- nose-finger bilaterally. Romberg negative. Gait: Able to rise from chair with arms crossed unassisted. Normal, narrow-based gait. Able to tandem walk. Able to walk on toes and heels.  Labs and Imaging review: Internal labs: Normal or unremarkable: CBC CMP significant for elevated glucose and mildly elevated Cr  External labs: Anti-CCP (02/27/18): negative  Imaging: MRI brain w/wo contrast (05/23/21): FINDINGS: Brain:   Cerebral volume appears normal for age.   No cortical encephalomalacia is identified. No significant cerebral white matter disease.   There is no acute infarct.   No evidence of an intracranial mass.   No chronic intracranial blood products.   No extra-axial fluid collection.   No midline shift.   No pathologic intracranial enhancement identified.   Vascular: Maintained flow voids within the proximal large arterial vessels. Small developmental venous anomaly within the mid right frontal lobe (anatomic variant).   Skull and upper cervical spine: No focal suspicious marrow lesion. Generalized T1 hypointense marrow signal within the calvarium.   Sinuses/Orbits: Visualized orbits show no acute finding. Trace mucosal thickening within the bilateral ethmoid sinuses. 2.7 cm mucous retention cyst within the left maxillary sinus. Subcentimeter mucous retention cyst within the right maxillary sinus.   Other: Trace fluid within the left mastoid air cells.   IMPRESSION: Unremarkable MRI appearance of the  brain. No evidence of acute intracranial abnormality.   Generalized T1 hypointense marrow signal within the calvarium. While this finding can reflect a marrow infiltrative process, the most common causes include chronic anemia, smoking and obesity.   Paranasal sinus  disease, as described.   Trace left mastoid effusion.  Assessment/Plan:  Arvella Massingale is a 46 y.o. female who presents for evaluation of numbness and tingling over right lateral thigh, numbness of left face, and right hand pain. She has a relevant medical history of vitamin D deficiency, low back pain, and obesity. Her neurological examination is pertinent for choppy pursuit and dizzy/lightheaded feeling with eye movements and diminished sensation over the right lateral thigh. Available diagnostic data is significant for MRI brain with and without contrast with no abnormalities, specifically no white matter disease or signs of demyelination.   Regarding patient's right lateral thigh symptoms, this is most consistent with meralgia paraesthetica, especially given that she is over weight and has been gaining weight recently.  Patient's right hand symptoms are not clear. She denies significant neck pain, and the symptoms and exam are not consistent with carpal tunnel syndrome. I will check for nerve pathology with an EMG, but this may be joint related given that the pain originates at the base of the 3rd digit per patient report.  Patient's left face numbness is of unclear etiology. She did have a viral illness prior to symptoms, so this could be a post viral syndrome that is slowly resolving. Given the other symptoms, demyelinating disease like MS is a possibility, but her MRI brain did not show evidence of this. Other possibilities include sarcoidosis, lupus, or other rheumatologic disease. Patient is re-establishing with rheum next month, but I will send some labs today and monitor for new or changing symptoms.  PLAN: -Blood work:  ANA, ENA, B12, B6, TSH -EMG: RUE (CTS protocol) -Over the counter Lidocaine cream recommended for tingling of right lateral thigh or right hand pain -Recommended weight loss as able for meralgia paraesthetica symptoms  -Return to clinic in 3 months  The impression above as well as the plan as outlined below were extensively discussed with the patient who voiced understanding. All questions were answered to their satisfaction.  When available, results of the above investigations and possible further recommendations will be communicated to the patient via telephone/MyChart. Patient to call office if not contacted after expected testing turnaround time.   Total time spent reviewing records, interview, history/exam, documentation, and coordination of care on day of encounter:  70 min   Thank you for allowing me to participate in patient's care.  If I can answer any additional questions, I would be pleased to do so.  Kai Levins, MD   CC: Jacqualine Code, DO [address] [place] [address]  CC: Referring provider: Jacqualine Code, [place] [address]

## 2022-01-17 ENCOUNTER — Other Ambulatory Visit (INDEPENDENT_AMBULATORY_CARE_PROVIDER_SITE_OTHER): Payer: BC Managed Care – PPO

## 2022-01-17 ENCOUNTER — Ambulatory Visit: Payer: BC Managed Care – PPO | Admitting: Neurology

## 2022-01-17 ENCOUNTER — Encounter: Payer: Self-pay | Admitting: Neurology

## 2022-01-17 VITALS — BP 130/80 | HR 75 | Ht 66.0 in | Wt 256.0 lb

## 2022-01-17 DIAGNOSIS — R200 Anesthesia of skin: Secondary | ICD-10-CM

## 2022-01-17 DIAGNOSIS — R209 Unspecified disturbances of skin sensation: Secondary | ICD-10-CM | POA: Diagnosis not present

## 2022-01-17 DIAGNOSIS — M79641 Pain in right hand: Secondary | ICD-10-CM | POA: Diagnosis not present

## 2022-01-17 DIAGNOSIS — G5711 Meralgia paresthetica, right lower limb: Secondary | ICD-10-CM

## 2022-01-17 LAB — TSH: TSH: 2.03 u[IU]/mL (ref 0.35–5.50)

## 2022-01-17 LAB — VITAMIN B12: Vitamin B-12: 206 pg/mL — ABNORMAL LOW (ref 211–911)

## 2022-01-17 NOTE — Patient Instructions (Signed)
I saw you today for left face numbness, right hand pain, and right thigh pain.  I think your right thigh pain is due to a nerve being compressed on the side of your leg. This is common if you are over weight or gaining weight. It is called meralgia paresthetica. Losing weight will help.  I am also recommending over the counter lidocaine cream. You rub this where you feel tingling. Wear gloves or your hands will also be numb. You can buy this at any drug store or online.  For your right hand pain, this may be joint related, but I want to look for a nerve cause. I recommend a muscle and nerve test called an EMG. More information on this is below. You can schedule this today.  For your face numbness, I am not sure the source of these symptoms. It could be related to a viral illness or another cause we haven't found yet, like an autoimmune disease. I would like to do some lab work today to look for potential causes. I want you to see rheumatology as planned next month.  Your MRI brain looked good without a cause of the symptoms. There is no current evidence for multiple sclerosis (MS).  I will be in touch when I have your results. I want to see you back in clinic in 3 months as well.  If you have new or worsening symptoms, please send me a message or call our office.  The physicians and staff at [facility] are committed to providing excellent care. You may receive a survey requesting feedback about your experience at our office. We strive to receive "very good" responses to the survey questions. If you feel that your experience would prevent you from giving the office a "very good " response, please contact our office to try to remedy the situation. We may be reached at [phone]. Thank you for taking the time out of your busy day to complete the survey.  Kai Levins, MD [facility]  ELECTROMYOGRAM AND NERVE CONDUCTION STUDIES (EMG/NCS) INSTRUCTIONS  How to Prepare The neurologist  conducting the EMG will need to know if you have certain medical conditions. Tell the neurologist and other EMG lab personnel if you: Have a pacemaker or any other electrical medical device Take blood-thinning medications Have hemophilia, a blood-clotting disorder that causes prolonged bleeding Bathing Take a shower or bath shortly before your exam in order to remove oils from your skin. Don't apply lotions or creams before the exam.  What to Expect You'll likely be asked to change into a hospital gown for the procedure and lie down on an examination table. The following explanations can help you understand what will happen during the exam.  Electrodes. The neurologist or a technician places surface electrodes at various locations on your skin depending on where you're experiencing symptoms. Or the neurologist may insert needle electrodes at different sites depending on your symptoms.  Sensations. The electrodes will at times transmit a tiny electrical current that you may feel as a twinge or spasm. The needle electrode may cause discomfort or pain that usually ends shortly after the needle is removed. If you are concerned about discomfort or pain, you may want to talk to the neurologist about taking a short break during the exam.  Instructions. During the needle EMG, the neurologist will assess whether there is any spontaneous electrical activity when the muscle is at rest - activity that isn't present in healthy muscle tissue - and the degree  of activity when you slightly contract the muscle.  He or she will give you instructions on resting and contracting a muscle at appropriate times. Depending on what muscles and nerves the neurologist is examining, he or she may ask you to change positions during the exam.  After your EMG You may experience some temporary, minor bruising where the needle electrode was inserted into your muscle. This bruising should fade within several days. If it persists,  contact your primary care doctor.

## 2022-01-22 LAB — VITAMIN B6: Vitamin B6: 11.6 ng/mL (ref 2.1–21.7)

## 2022-01-23 ENCOUNTER — Encounter: Payer: Self-pay | Admitting: Neurology

## 2022-01-23 LAB — ANA+ENA+DNA/DS+SCL 70+SJOSSA/B
ANA Titer 1: POSITIVE — AB
ENA RNP Ab: <0.2 AI (ref 0.0–0.9)
ENA SM Ab Ser-aCnc: <0.2 AI (ref 0.0–0.9)
ENA SSA (RO) Ab: <0.2 AI (ref 0.0–0.9)
ENA SSB (LA) Ab: <0.2 AI (ref 0.0–0.9)
Scleroderma (Scl-70) (ENA) Antibody, IgG: <0.2 AI (ref 0.0–0.9)
dsDNA Ab: 1 IU/mL (ref 0–9)

## 2022-01-23 LAB — FANA STAINING PATTERNS: Speckled Pattern: 1:80 {titer}

## 2022-03-18 ENCOUNTER — Telehealth: Payer: Self-pay | Admitting: Neurology

## 2022-03-18 ENCOUNTER — Ambulatory Visit: Payer: BC Managed Care – PPO | Admitting: Neurology

## 2022-03-18 DIAGNOSIS — R200 Anesthesia of skin: Secondary | ICD-10-CM

## 2022-03-18 DIAGNOSIS — M79641 Pain in right hand: Secondary | ICD-10-CM

## 2022-03-18 DIAGNOSIS — R209 Unspecified disturbances of skin sensation: Secondary | ICD-10-CM

## 2022-03-18 DIAGNOSIS — G5711 Meralgia paresthetica, right lower limb: Secondary | ICD-10-CM

## 2022-03-18 DIAGNOSIS — G5601 Carpal tunnel syndrome, right upper limb: Secondary | ICD-10-CM

## 2022-03-18 MED ORDER — DULOXETINE HCL 30 MG PO CPEP: 60.0000 mg | ORAL_CAPSULE | Freq: Every day | ORAL | 5 refills | Status: DC

## 2022-03-18 NOTE — Telephone Encounter (Signed)
Discussed the results of patient's EMG after her procedure today. It showed mild right sided carpal tunnel syndrome. I recommended a cock up wrist splint at night. Patient ordered from [facility] before I left.   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 She does not get enough relief with lidocaine cream. She is interested in starting Cymbalta. She will take 30 mg qhs for 1 week, then if no side effects increase to 60 mg qhs.  She will follow up next month.  All questions were answered.  Kai Levins, MD [facility]

## 2022-03-18 NOTE — Procedures (Signed)
[facility]  [address]  [place],  [address] Tel: [phone] Fax: [phone] Test Date:  03/18/2022  Patient: Nancy Reid DOB: 11/05/1975 Physician: Kai Levins, MD  Sex: Female Height: '5\' 6"'$  Ref Phys: Kai Levins, MD  ID#: [id number]   Technician:    History: This is a 46 year old female with paresthesia in right upper limb.  NCV & EMG Findings: Extensive electrodiagnostic evaluation of the right upper limb shows: Right median, ulnar, and radial sensory responses are within normal limits. The right median palmar to ulnar palmar peak latency difference is abnormal (0.47 ms). Right median and ulnar motor responses are within normal limits. There is no evidence of active or chronic motor axon loss changes affecting any of the tested muscles. Motor unit configuration and recruitment pattern is within normal limits.  Impression: This is an abnormal electrodiagnostic study. The findings are most consistent with the following: Evidence of a right median mononeuropathy at or distal to the wrist, consistent with carpal tunnel syndrome, very mild in degree electrically. No electrodiagnostic evidence of a right cervical (C5-C8) motor radiculopathy. Screening studies for a right ulnar or radial mononeuropathy are normal.   ___________________________ Kai Levins, MD    Nerve Conduction Studies Motor Nerve Results    Latency Amplitude F-Lat Segment Distance CV Comment  Site (ms) Norm (mV) Norm (ms)  (cm) (m/s) Norm   Right Median (APB) Motor  Wrist 2.8  < 3.9 10.9  > 6.0        Elbow 7.1 - 10.7 -  Elbow-Wrist 26 60  > 50   Right Ulnar (ADM) Motor  Wrist 1.88  < 3.1 14.2  > 7.0        Bel elbow 5.7 - 13.7 -  Bel elbow-Wrist 23.5 62  > 50   Ab elbow 7.3 - 11.8 -  Ab elbow-Bel elbow 10 63 -    Sensory Sites    Neg Peak Lat Amplitude (O-P) Segment Distance Velocity Comment  Site (ms) Norm (V) Norm  (cm) (ms)   Right Median Sensory  Wrist  3.4  < 3.4 36  > 20 Wrist-Dig II 13    Right Median-Ulnar Palmar Sensory       Median  Palm 2.1  < 2.2 63  > 10 Palm-Wrist 8         Ulnar  Palm-Wrist 1.63  < 2.2 22  > 5 Palm-Wrist 8    Right Radial Sensory  Forearm-Wrist 2.2  < 2.7 35  > 18 Forearm-Wrist 10    Right Ulnar Sensory  Wrist-Dig V 2.8  < 3.1 39  > 12 Wrist-Dig V 11     Inter-Nerve Comparisons   Nerve 1 Value 1 Nerve 2 Value 2 Parameter Result Normal  Sensory Sites  R Median Palm-Wrist 2.1 ms R Ulnar Palm-Wrist 1.63 ms Peak Lat Diff *0.47 ms <0.40   Electromyography   Side Muscle Ins.Act Fibs Fasc Recrt Amp Dur Poly Activation Comment  Right FDI Nml Nml Nml Nml Nml Nml Nml Nml N/A  Right EIP Nml Nml Nml Nml Nml Nml Nml Nml N/A  Right Pronator teres Nml Nml Nml Nml Nml Nml Nml Nml N/A  Right Biceps Nml Nml Nml Nml Nml Nml Nml Nml N/A  Right Triceps Nml Nml Nml Nml Nml Nml Nml Nml N/A  Right Deltoid Nml Nml Nml Nml Nml Nml Nml Nml N/A      Waveforms:  Motor      Sensory

## 2022-04-10 ENCOUNTER — Other Ambulatory Visit: Payer: Self-pay | Admitting: Neurology

## 2022-04-10 DIAGNOSIS — R209 Unspecified disturbances of skin sensation: Secondary | ICD-10-CM

## 2022-04-10 DIAGNOSIS — M79641 Pain in right hand: Secondary | ICD-10-CM

## 2022-04-10 DIAGNOSIS — G5711 Meralgia paresthetica, right lower limb: Secondary | ICD-10-CM

## 2022-04-16 NOTE — Progress Notes (Deleted)
NEUROLOGY FOLLOW UP OFFICE NOTE  Nancy Reid [id number]  Subjective:  Nancy Reid is a 46 y.o. year old right-handed female with a medical history of vitamin D deficiency, low back pain, and obesity who we last saw on 01/17/22.  To briefly review: Patient has numbness on the left side of her face, like her face is asleep. It has been going on since about 04/2021. It comes and goes. At first, it was once daily. Then it slowed down. She now can touch her left face and feel like the sensation is coming back. The numbness now only happens once a week. She denies any facial drooping or facial weakness. She may have a viral like illness earlier in the month (04/2022). She denies skin rash or hiking or anything concerning for a tick bite. She denies significant headaches currently, but did have some headaches around 03/2021-04/2021. She was given Roselyn Meier and took a couple of doses. This resolved her headaches.   She went to her PCP who ordered an MRI brain w/wo contrast. It was essentially normal but there was some sinus issues seen. She saw ENT who did not feel patient needed intervention.   She went to a chiropractor who had back and neck adjustment. She had dizziness afterward. She denies significant neck pain, but did have some there. After a few adjustments and shock therapy, she did get relief of upper back pain.   Patient has numbness/tingling that radiates down her right hip and leg. It is a pins and needles sensation in her leg that can wake her up. It is more on the side of her right thigh. It prevents her from wearing jeans or anything tight on that leg. Her back has a burning sensation. She has no symptoms in her left leg. This has been going on for about 2.5 months. She saw her PCP who said she had a muscle spasm. She was given muscle relaxers which helped but caused her to be too out of it so only took 2 doses.    She endorses weight gain. She does not feel like she can work out  because her legs and hands swell. She has seen rheumatology before but is re-establishing care next month. She has seen cardiology and not felt to have cardiac problems.   Patient also has pain in right hand that radiates up into arm. She points at the base of the third digit as the source of the pain. This has been present since 06/2021. She went to a hand specialist. She received cortisone shot in her right palm (~08/2021). She has not had good range of motion in her hands. A 3rd digit splint was given to her that she still wears. She drops things occasionally.  Most recent Assessment and Plan (01/17/22): Her neurological examination is pertinent for choppy pursuit and dizzy/lightheaded feeling with eye movements and diminished sensation over the right lateral thigh.    Regarding patient's right lateral thigh symptoms, this is most consistent with meralgia paraesthetica, especially given that she is over weight and has been gaining weight recently.   Patient's right hand symptoms are not clear. She denies significant neck pain, and the symptoms and exam are not consistent with carpal tunnel syndrome. I will check for nerve pathology with an EMG, but this may be joint related given that the pain originates at the base of the 3rd digit per patient report.   Patient's left face numbness is of unclear etiology. She did have a viral illness prior  to symptoms, so this could be a post viral syndrome that is slowly resolving. Given the other symptoms, demyelinating disease like MS is a possibility, but her MRI brain did not show evidence of this. Other possibilities include sarcoidosis, lupus, or other rheumatologic disease. Patient is re-establishing with rheum next month, but I will send some labs today and monitor for new or changing symptoms.   PLAN: -Blood work: ANA, ENA, B12, B6, TSH -EMG: RUE (CTS protocol) -Over the counter Lidocaine cream recommended for tingling of right lateral thigh or right hand  pain -Recommended weight loss as able for meralgia paraesthetica symptoms  Since their last visit: B12 was low at 206. Supplementation with 1000 mcg daily was recommended. *** ANA was positive at 1:80 (speckled pattern). She saw rheumatology on 03/04/22*** ***  EMG on 03/18/22 of the RUE showed very mild right carpal tunnel syndrome.  Patient was given information on wrist splints***. I also prescribed Cymbalta 60 mg daily on 03/18/22.***  She tried lidocaine cream***.  MEDICATIONS:  Outpatient Encounter Medications as of 04/19/2022  Medication Sig   diclofenac (VOLTAREN) 75 MG EC tablet TAKE 1 TABLET BY MOUTH TWICE A DAY   DULoxetine (CYMBALTA) 30 MG capsule Take 2 capsules (60 mg total) by mouth at bedtime. Take 1 capsule (30 mg) at night for 1 week. If no side effects, then increase to 2 capsules (60 mg) thereafter.   ondansetron (ZOFRAN) 4 MG tablet Take 1 tablet (4 mg total) by mouth every 6 (six) hours as needed for nausea. (Patient not taking: Reported on 01/17/2022)   terbinafine (LAMISIL) 250 MG tablet Please take one a day x 7days, repeat every 4 weeks x 4 months (Patient not taking: Reported on 01/17/2022)   valACYclovir (VALTREX) 500 MG tablet Take 500 mg by mouth daily.    Vitamin D, Ergocalciferol, (DRISDOL) 1.25 MG (50000 UNIT) CAPS capsule Take 50,000 Units by mouth once a week.   No facility-administered encounter medications on file as of 04/19/2022.    PAST MEDICAL HISTORY: Past Medical History:  Diagnosis Date   Anginal pain ([facility]) 2019   Family history of adverse reaction to anesthesia    N&V   History of kidney stones     PAST SURGICAL HISTORY: Past Surgical History:  Procedure Laterality Date   ABDOMINAL HYSTERECTOMY     BREAST SURGERY Bilateral 2014   reduction   CESAREAN SECTION     x2   CYSTOSCOPY Bilateral 12/09/2019   Procedure: CYSTOSCOPY;  Surgeon: Deliah Boston, MD;  Location: [facility];  Service: Gynecology;  Laterality:  Bilateral;   HERNIA REPAIR     umbilical   KNEE SURGERY Left    age 89   TONSILLECTOMY     TOTAL LAPAROSCOPIC HYSTERECTOMY WITH SALPINGECTOMY Bilateral 12/09/2019   Procedure: TOTAL LAPAROSCOPIC HYSTERECTOMY WITH SALPINGECTOMY,LYSIS OF ADHESIONS;  Surgeon: Deliah Boston, MD;  Location: [facility];  Service: Gynecology;  Laterality: Bilateral;    ALLERGIES: Allergies  Allergen Reactions   Sulfa Antibiotics Itching   Iodine Itching   Shellfish Allergy Itching and Swelling   Sulfamethoxazole-Trimethoprim Itching    FAMILY HISTORY: Family History  Problem Relation Age of Onset   Healthy Mother    Healthy Father     SOCIAL HISTORY: Social History   Tobacco Use   Smoking status: Never   Smokeless tobacco: Never  Vaping Use   Vaping Use: Never used  Substance Use Topics   Alcohol use: Yes   Drug use: Never  Social History   Social History Narrative   Right handed   Caffeine 4 cups daily   Live two level home with husband       Objective:  Vital Signs:  LMP 10/09/2019 (Approximate)   ***  Labs and Imaging review: New results: 01/17/22: B12: 206 ANA: positive 1:80 (speckled) Normal or unremarkable: TSH, B6, ENA  External labs: 03/04/22: Normal or unremarkable: CCP, ESR CRP mildly elevated to 6.6  EMG (03/18/22): NCV & EMG Findings: Extensive electrodiagnostic evaluation of the right upper limb shows: Right median, ulnar, and radial sensory responses are within normal limits. The right median palmar to ulnar palmar peak latency difference is abnormal (0.47 ms). Right median and ulnar motor responses are within normal limits. There is no evidence of active or chronic motor axon loss changes affecting any of the tested muscles. Motor unit configuration and recruitment pattern is within normal limits.   Impression: This is an abnormal electrodiagnostic study. The findings are most consistent with the following: Evidence of a right median  mononeuropathy at or distal to the wrist, consistent with carpal tunnel syndrome, very mild in degree electrically. No electrodiagnostic evidence of a right cervical (C5-C8) motor radiculopathy. Screening studies for a right ulnar or radial mononeuropathy are normal.  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

## 2022-04-19 ENCOUNTER — Encounter: Payer: Self-pay | Admitting: Neurology

## 2022-04-19 ENCOUNTER — Ambulatory Visit: Payer: BC Managed Care – PPO | Admitting: Neurology

## 2022-04-19 DIAGNOSIS — Z029 Encounter for administrative examinations, unspecified: Secondary | ICD-10-CM

## 2023-01-31 ENCOUNTER — Encounter: Payer: Self-pay | Admitting: Neurology

## 2023-02-04 NOTE — Progress Notes (Deleted)
NEUROLOGY FOLLOW UP OFFICE NOTE  Nancy Reid [id number]  Subjective:  Nancy Reid is a 47 y.o. year old right-handed female with a medical history of vitamin D deficiency, low back pain who we last saw on 01/17/22 for paresthesias.  To briefly review: 01/17/22: Patient has numbness on the left side of her face, like her face is asleep. It has been going on since about 04/2021. It comes and goes. At first, it was once daily. Then it slowed down. She now can touch her left face and feel like the sensation is coming back. The numbness now only happens once a week. She denies any facial drooping or facial weakness. She may have a viral like illness earlier in the month (04/2022). She denies skin rash or hiking or anything concerning for a tick bite. She denies significant headaches currently, but did have some headaches around 03/2021-04/2021. She was given Bernita Raisin and took a couple of doses. This resolved her headaches.   She went to her PCP who ordered an MRI brain w/wo contrast. It was essentially normal but there was some sinus issues seen. She saw ENT who did not feel patient needed intervention.   She went to a chiropractor who had back and neck adjustment. She had dizziness afterward. She was dizzy. She went back to the chiropractor who told her that her left eye "was behind". She cannot remember what she was told. She denies significant neck pain, but did have some there. After a few adjustments and shock therapy, she did get relief of upper back pain.   Patient has numbness/tingling that radiates down her right hip and leg. It is a pins and needles sensation in her leg that can wake her up. It is more on the side of her right thigh. It prevents her from wearing jeans or anything tight on that leg. Her back has a burning sensation. She has no symptoms in her left leg. This has been going on for about 2.5 months. She saw her PCP who said she had a muscle spasm. She was given muscle relaxers  which helped but caused her to be too out of it so only took 2 doses.    She endorses weight gain. She does not feel like she can work out because her legs and hands swell. She has seen rheumatology before but is re-establishing care next month. She has seen cardiology and not felt to have cardiac problems.   Patient also has pain in right hand that radiates up into arm. She points at the base of the third digit as the source of the pain. This has been present since 06/2021. She went to a hand specialist. She received cortisone shot in her right palm (~08/2021). She has not had good range of motion in her hands. A 3rd digit splint was given to her that she still wears. She drops things occasionally.  Most recent Assessment and Plan (01/17/22): Nancy Reid is a 47 y.o. female who presents for evaluation of numbness and tingling over right lateral thigh, numbness of left face, and right hand pain. She has a relevant medical history of vitamin D deficiency, low back pain, and obesity. Her neurological examination is pertinent for choppy pursuit and dizzy/lightheaded feeling with eye movements and diminished sensation over the right lateral thigh. Available diagnostic data is significant for MRI brain with and without contrast with no abnormalities, specifically no white matter disease or signs of demyelination.    Regarding patient's right lateral thigh symptoms, this  is most consistent with meralgia paraesthetica, especially given that she is over weight and has been gaining weight recently.   Patient's right hand symptoms are not clear. She denies significant neck pain, and the symptoms and exam are not consistent with carpal tunnel syndrome. I will check for nerve pathology with an EMG, but this may be joint related given that the pain originates at the base of the 3rd digit per patient report.   Patient's left face numbness is of unclear etiology. She did have a viral illness prior to symptoms, so this  could be a post viral syndrome that is slowly resolving. Given the other symptoms, demyelinating disease like MS is a possibility, but her MRI brain did not show evidence of this. Other possibilities include sarcoidosis, lupus, or other rheumatologic disease. Patient is re-establishing with rheum next month, but I will send some labs today and monitor for new or changing symptoms.   PLAN: -Blood work: ANA, ENA, B12, B6, TSH -EMG: RUE (CTS protocol) -Over the counter Lidocaine cream recommended for tingling of right lateral thigh or right hand pain -Recommended weight loss as able for meralgia paraesthetica symptoms  Since their last visit: Labs were significant for low B12 and weakly positive ANA. I recommended patient supplement B12 with 1000 mcg of B12 daily. ***  EMG on 03/18/22 showed mild right carpal tunnel syndrome. I recommend wrist bracing for this.  Patient did not improve with lidocaine cream, so I prescribed Cymbalta 60 mg daily on 03/18/22.  ***  MEDICATIONS:  Outpatient Encounter Medications as of 02/05/2023  Medication Sig   diclofenac (VOLTAREN) 75 MG EC tablet TAKE 1 TABLET BY MOUTH TWICE A DAY   DULoxetine (CYMBALTA) 30 MG capsule Take 2 capsules (60 mg total) by mouth at bedtime. Take 1 capsule (30 mg) at night for 1 week. If no side effects, then increase to 2 capsules (60 mg) thereafter.   ondansetron (ZOFRAN) 4 MG tablet Take 1 tablet (4 mg total) by mouth every 6 (six) hours as needed for nausea. (Patient not taking: Reported on 01/17/2022)   terbinafine (LAMISIL) 250 MG tablet Please take one a day x 7days, repeat every 4 weeks x 4 months (Patient not taking: Reported on 01/17/2022)   valACYclovir (VALTREX) 500 MG tablet Take 500 mg by mouth daily.    Vitamin D, Ergocalciferol, (DRISDOL) 1.25 MG (50000 UNIT) CAPS capsule Take 50,000 Units by mouth once a week.   No facility-administered encounter medications on file as of 02/05/2023.    PAST MEDICAL HISTORY: Past  Medical History:  Diagnosis Date   Anginal pain ([facility]) 2019   Family history of adverse reaction to anesthesia    N&V   History of kidney stones     PAST SURGICAL HISTORY: Past Surgical History:  Procedure Laterality Date   ABDOMINAL HYSTERECTOMY     BREAST SURGERY Bilateral 2014   reduction   CESAREAN SECTION     x2   CYSTOSCOPY Bilateral 12/09/2019   Procedure: CYSTOSCOPY;  Surgeon: Carlisle Cater, MD;  Location: [facility];  Service: Gynecology;  Laterality: Bilateral;   HERNIA REPAIR     umbilical   KNEE SURGERY Left    age 45   TONSILLECTOMY     TOTAL LAPAROSCOPIC HYSTERECTOMY WITH SALPINGECTOMY Bilateral 12/09/2019   Procedure: TOTAL LAPAROSCOPIC HYSTERECTOMY WITH SALPINGECTOMY,LYSIS OF ADHESIONS;  Surgeon: Carlisle Cater, MD;  [facility];  Service: Gynecology;  Laterality: Bilateral;    ALLERGIES: Allergies  Allergen Reactions  Sulfa Antibiotics Itching   Iodine Itching   Shellfish Allergy Itching and Swelling   Sulfamethoxazole-Trimethoprim Itching    FAMILY HISTORY: Family History  Problem Relation Age of Onset   Healthy Mother    Healthy Father     SOCIAL HISTORY: Social History   Tobacco Use   Smoking status: Never   Smokeless tobacco: Never  Vaping Use   Vaping status: Never Used  Substance Use Topics   Alcohol use: Yes   Drug use: Never   Social History   Social History Narrative   Right handed   Caffeine 4 cups daily   Live two level home with husband       Objective:  Vital Signs:  LMP 10/09/2019 (Approximate)   ***  Labs and Imaging review: New results: 01/17/22: B12: 206 ANA positive (1:80, speckled pattern) TSH wnl B6 wnl ENA (RNP, dsDNA, Sm ab, Scl-70, SSA, SSB) negative  External labs 03/04/22: CCP negative ESR wnl CRP mildly elevated at 6.6  EMG (03/18/22): NCV & EMG Findings: Extensive electrodiagnostic evaluation of the right upper limb shows: Right median, ulnar,  and radial sensory responses are within normal limits. The right median palmar to ulnar palmar peak latency difference is abnormal (0.47 ms). Right median and ulnar motor responses are within normal limits. There is no evidence of active or chronic motor axon loss changes affecting any of the tested muscles. Motor unit configuration and recruitment pattern is within normal limits.   Impression: This is an abnormal electrodiagnostic study. The findings are most consistent with the following: Evidence of a right median mononeuropathy at or distal to the wrist, consistent with carpal tunnel syndrome, very mild in degree electrically. No electrodiagnostic evidence of a right cervical (C5-C8) motor radiculopathy. Screening studies for a right ulnar or radial mononeuropathy are normal.  Previously reviewed results: MRI brain w/wo contrast (05/23/21): FINDINGS: Brain:   Cerebral volume appears normal for age.   No cortical encephalomalacia is identified. No significant cerebral white matter disease.   There is no acute infarct.   No evidence of an intracranial mass.   No chronic intracranial blood products.   No extra-axial fluid collection.   No midline shift.   No pathologic intracranial enhancement identified.   Vascular: Maintained flow voids within the proximal large arterial vessels. Small developmental venous anomaly within the mid right frontal lobe (anatomic variant).   Skull and upper cervical spine: No focal suspicious marrow lesion. Generalized T1 hypointense marrow signal within the calvarium.   Sinuses/Orbits: Visualized orbits show no acute finding. Trace mucosal thickening within the bilateral ethmoid sinuses. 2.7 cm mucous retention cyst within the left maxillary sinus. Subcentimeter mucous retention cyst within the right maxillary sinus.   Other: Trace fluid within the left mastoid air cells.   IMPRESSION: Unremarkable MRI appearance of the brain. No evidence  of acute intracranial abnormality.   Generalized T1 hypointense marrow signal within the calvarium. While this finding can reflect a marrow infiltrative process, the most common causes include chronic anemia, smoking and obesity.   Paranasal sinus disease, as described.   Trace left mastoid effusion.  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

## 2023-02-05 ENCOUNTER — Ambulatory Visit: Payer: BC Managed Care – PPO | Admitting: Neurology

## 2023-05-08 IMAGING — MR MR HEAD WO/W CM
13 series · 48 of 48 positions shown · IV contrast (multihance)
Comparison: No pertinent prior exams available for comparison.

CLINICAL DATA: Provided history: Paresthesia. Additional history
provided by scanning technologist: Patient reports numbness left
side of face, twitches, symptoms for 9 months.

EXAM:
MRI HEAD WITHOUT AND WITH CONTRAST
TECHNIQUE: Multiplanar, multiecho pulse sequences of the brain and surrounding
structures were obtained without and with intravenous contrast.
CONTRAST:  20mL MULTIHANCE GADOBENATE DIMEGLUMINE 529 MG/ML IV SOLN

[Series 2: T1 · sagittal · 5.0mm · 0.45mm/px · 2 of 25 slices shown]
[im 1/25]
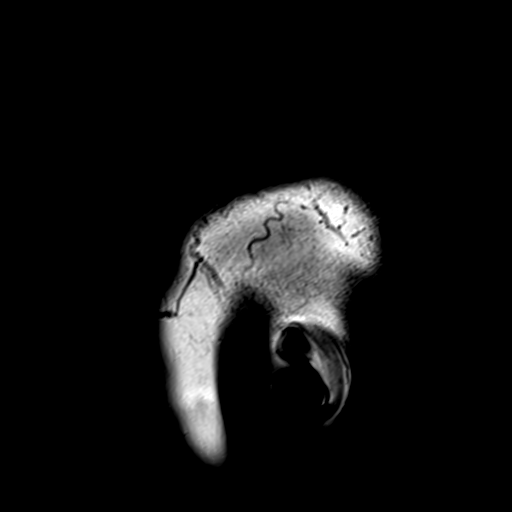
[im 25/25]
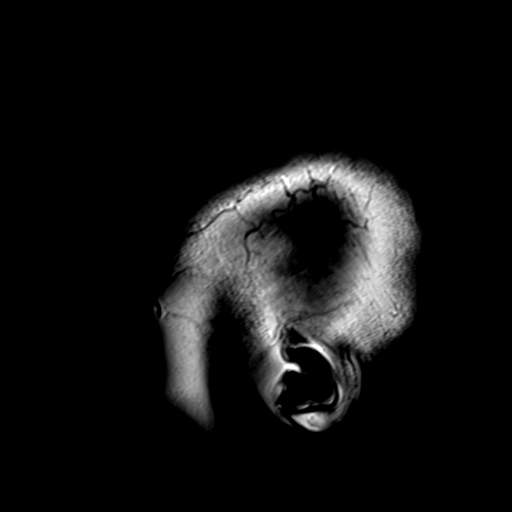

[Series 3: ax ep2d_diff_3 · axial · 3.0mm · 1.80mm/px · z∈[-51,+111]mm · 6 of 109 slices shown]
[im 1/109]
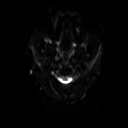
[im 22/109]
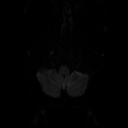
[im 44/109]
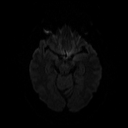
[im 65/109]
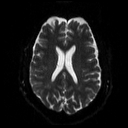
[im 87/109]
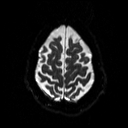
[im 109/109]
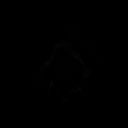

[Series 4: ax ep2d_diff_3_adc · axial · 3.0mm · 1.80mm/px · z∈[-51,+111]mm · 3 of 54 slices shown]
[im 1/54]
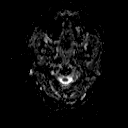
[im 27/54]
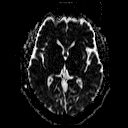
[im 54/54]
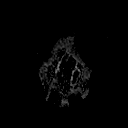

[Series 5: cor ep2d_diff · coronal · 5.0mm · 1.77mm/px · 3 of 60 slices shown]
[im 1/60]
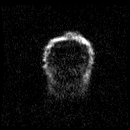
[im 30/60]
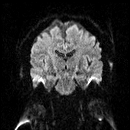
[im 60/60]
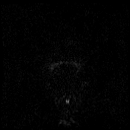

[Series 6: cor ep2d_diff_adc · coronal · 5.0mm · 1.77mm/px · 2 of 30 slices shown]
[im 1/30]
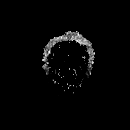
[im 30/30]
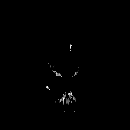

[Series 8: swi_images · axial · 2.0mm · 0.98mm/px · z∈[-48,+110]mm · 5 of 80 slices shown]
[im 1/80]
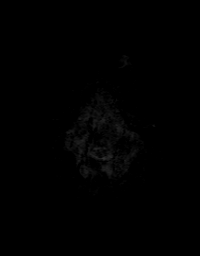
[im 20/80]
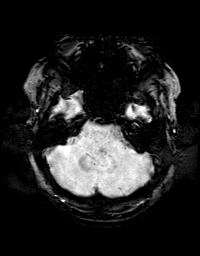
[im 40/80]
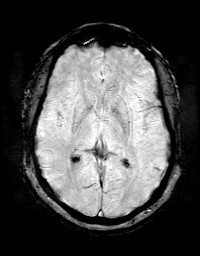
[im 60/80]
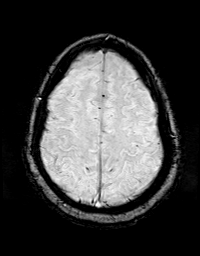
[im 80/80]
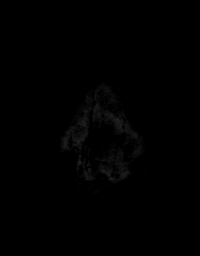

[Series 9: FLAIR · axial · 3.0mm · 0.47mm/px · z∈[-46,+106]mm · 2 of 40 slices shown (1 of 2)]
[im 1/40]
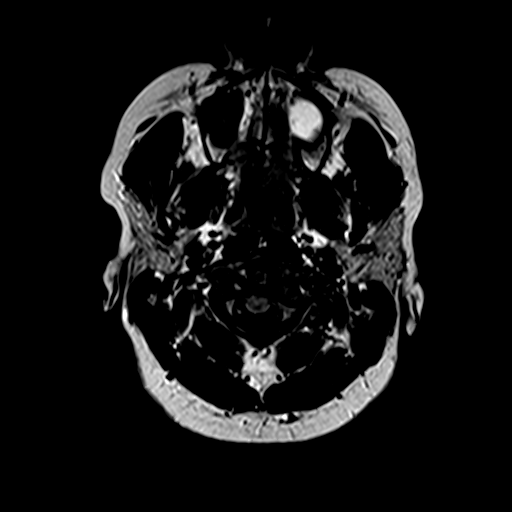
[im 40/40]
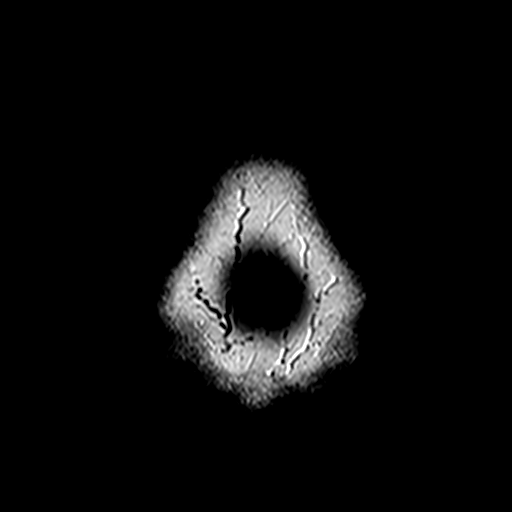

[Series 10: T2 · axial · 5.0mm · 0.65mm/px · z∈[-53,+115]mm · 2 of 29 slices shown (1 of 2)]
[im 1/29]
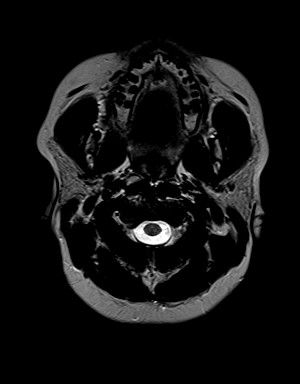
[im 29/29]
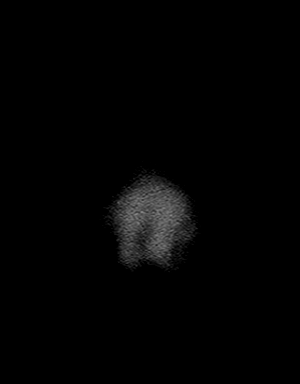

[Series 11: t1_mpr_tra · axial · 1.0mm · 0.75mm/px · z∈[-49,+110]mm · 9 of 160 slices shown]
[im 1/160]
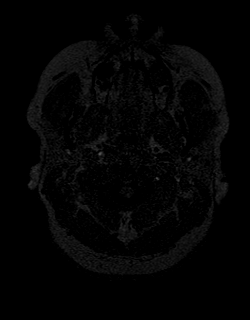
[im 20/160]
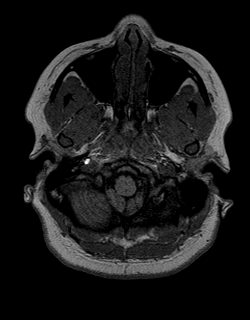
[im 40/160]
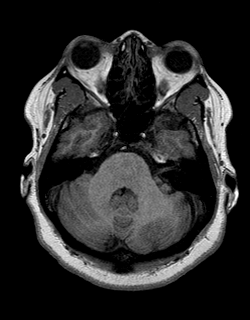
[im 60/160]
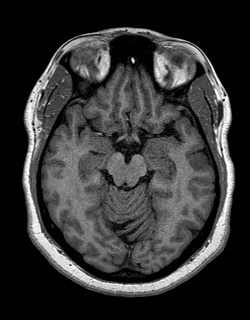
[im 80/160]
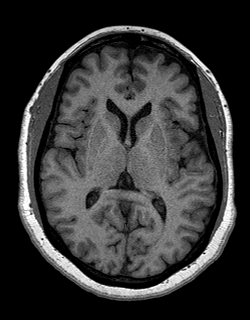
[im 100/160]
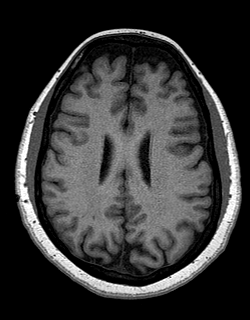
[im 120/160]
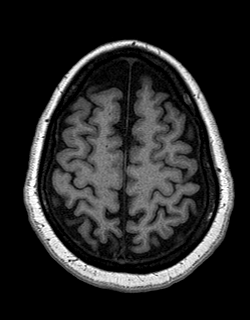
[im 140/160]
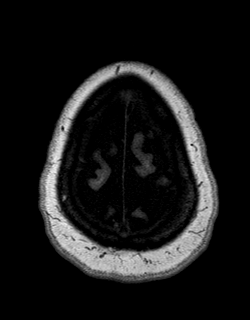
[im 160/160]
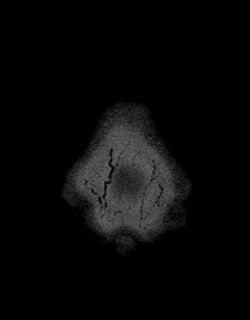

[Series 12: FLAIR · sagittal · 3.0mm · 0.43mm/px · 2 of 40 slices shown (2 of 2)]
[im 1/40]
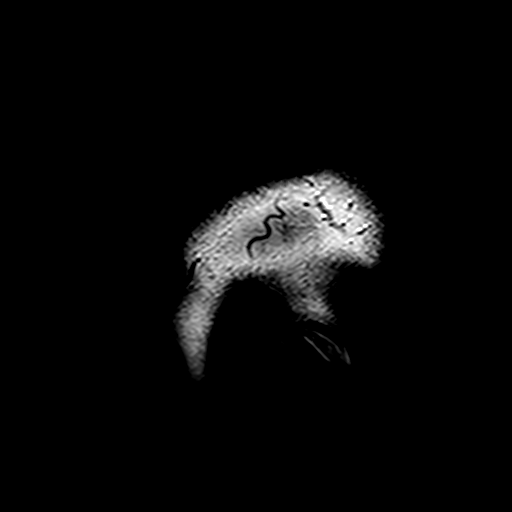
[im 40/40]
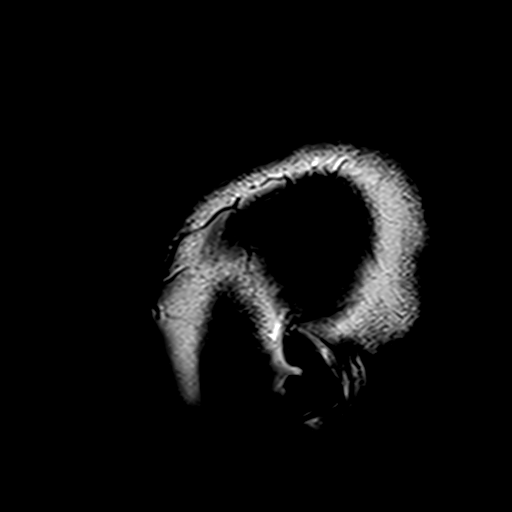

[Series 13: T2 · coronal · 5.0mm · 0.43mm/px · 2 of 28 slices shown (2 of 2)]
[im 1/28]
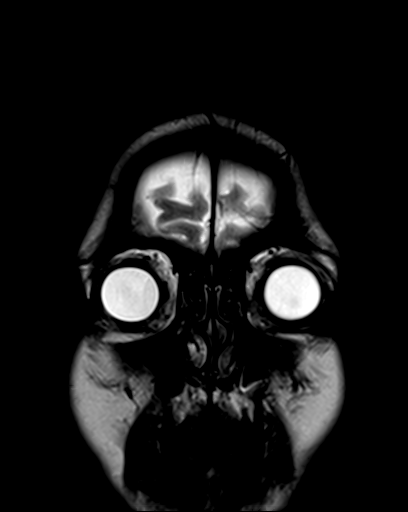
[im 28/28]
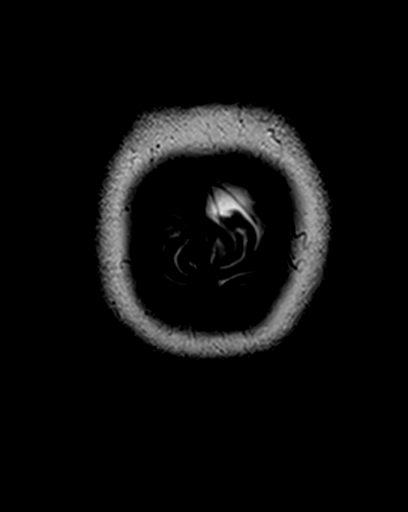

[Series 14: post t1_mpr_tra · axial · 1.0mm · 0.72mm/px · z∈[-49,+110]mm · 9 of 160 slices shown]
[im 1/160]
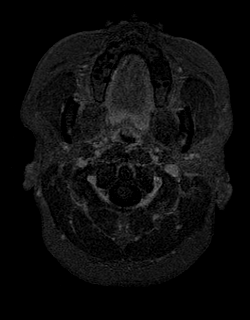
[im 20/160]
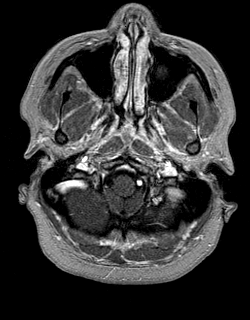
[im 40/160]
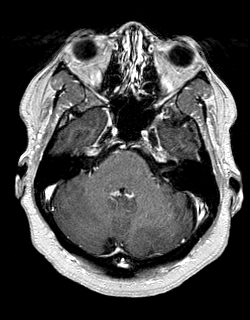
[im 60/160]
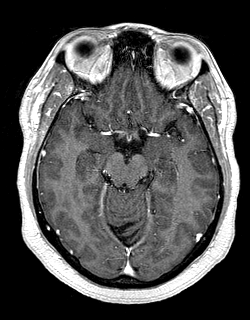
[im 80/160]
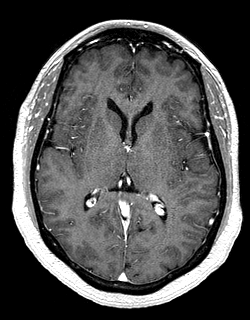
[im 100/160]
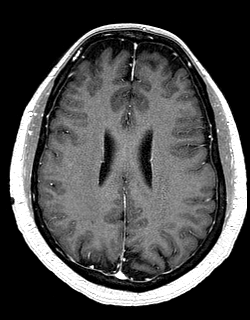
[im 120/160]
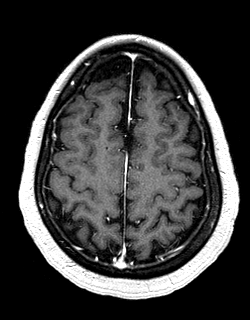
[im 140/160]
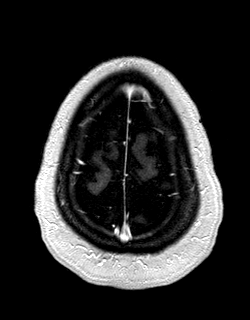
[im 160/160]
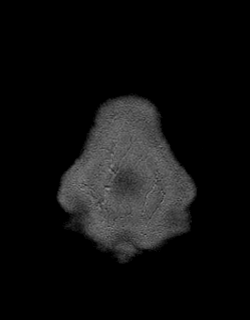

[Series 15: T1 post-contrast · coronal · 5.0mm · 0.72mm/px · 1 of 26 slices shown]
[im 1/26]
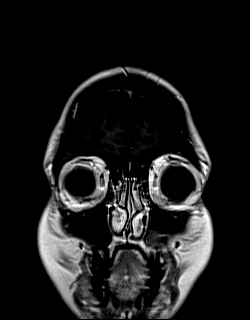

[48 of 48 positions shown; findings below may reference images not displayed]

FINDINGS: Brain:

Cerebral volume appears normal for age.

No cortical encephalomalacia is identified. No significant cerebral
white matter disease.

There is no acute infarct.

No evidence of an intracranial mass.

No chronic intracranial blood products.

No extra-axial fluid collection.

No midline shift.

No pathologic intracranial enhancement identified.

Vascular: Maintained flow voids within the proximal large arterial
vessels. Small developmental venous anomaly within the mid right
frontal lobe (anatomic variant).

Skull and upper cervical spine: No focal suspicious marrow lesion.
Generalized T1 hypointense marrow signal within the calvarium.

Sinuses/Orbits: Visualized orbits show no acute finding. Trace
mucosal thickening within the bilateral ethmoid sinuses. 2.7 cm
mucous retention cyst within the left maxillary sinus. Subcentimeter
mucous retention cyst within the right maxillary sinus.

Other: Trace fluid within the left mastoid air cells.
IMPRESSION: Unremarkable MRI appearance of the brain. No evidence of acute
intracranial abnormality.

Generalized T1 hypointense marrow signal within the calvarium. While
this finding can reflect a marrow infiltrative process, the most
common causes include chronic anemia, smoking and obesity.

Paranasal sinus disease, as described.

Trace left mastoid effusion.

## 2023-05-12 NOTE — Progress Notes (Deleted)
NEUROLOGY FOLLOW UP OFFICE NOTE  Arlin Savona [id number]  Subjective:  Nancy Reid is a 48 y.o. year old right-handed female with a medical history of vitamin D deficiency, low back pain who we last saw on 01/17/22 for paresthesias.  To briefly review: 01/17/22: Patient has numbness on the left side of her face, like her face is asleep. It has been going on since about 04/2021. It comes and goes. At first, it was once daily. Then it slowed down. She now can touch her left face and feel like the sensation is coming back. The numbness now only happens once a week. She denies any facial drooping or facial weakness. She may have a viral like illness earlier in the month (04/2022). She denies skin rash or hiking or anything concerning for a tick bite. She denies significant headaches currently, but did have some headaches around 03/2021-04/2021. She was given Bernita Raisin and took a couple of doses. This resolved her headaches.   She went to her PCP who ordered an MRI brain w/wo contrast. It was essentially normal but there was some sinus issues seen. She saw ENT who did not feel patient needed intervention.   She went to a chiropractor who had back and neck adjustment. She had dizziness afterward. She was dizzy. She went back to the chiropractor who told her that her left eye "was behind". She cannot remember what she was told. She denies significant neck pain, but did have some there. After a few adjustments and shock therapy, she did get relief of upper back pain.   Patient has numbness/tingling that radiates down her right hip and leg. It is a pins and needles sensation in her leg that can wake her up. It is more on the side of her right thigh. It prevents her from wearing jeans or anything tight on that leg. Her back has a burning sensation. She has no symptoms in her left leg. This has been going on for about 2.5 months. She saw her PCP who said she had a muscle spasm. She was given muscle relaxers  which helped but caused her to be too out of it so only took 2 doses.    She endorses weight gain. She does not feel like she can work out because her legs and hands swell. She has seen rheumatology before but is re-establishing care next month. She has seen cardiology and not felt to have cardiac problems.   Patient also has pain in right hand that radiates up into arm. She points at the base of the third digit as the source of the pain. This has been present since 06/2021. She went to a hand specialist. She received cortisone shot in her right palm (~08/2021). She has not had good range of motion in her hands. A 3rd digit splint was given to her that she still wears. She drops things occasionally.  Most recent Assessment and Plan (01/17/22): Nancy Reid is a 48 y.o. female who presents for evaluation of numbness and tingling over right lateral thigh, numbness of left face, and right hand pain. She has a relevant medical history of vitamin D deficiency, low back pain, and obesity. Her neurological examination is pertinent for choppy pursuit and dizzy/lightheaded feeling with eye movements and diminished sensation over the right lateral thigh. Available diagnostic data is significant for MRI brain with and without contrast with no abnormalities, specifically no white matter disease or signs of demyelination.    Regarding patient's right lateral thigh symptoms, this  is most consistent with meralgia paraesthetica, especially given that she is over weight and has been gaining weight recently.   Patient's right hand symptoms are not clear. She denies significant neck pain, and the symptoms and exam are not consistent with carpal tunnel syndrome. I will check for nerve pathology with an EMG, but this may be joint related given that the pain originates at the base of the 3rd digit per patient report.   Patient's left face numbness is of unclear etiology. She did have a viral illness prior to symptoms, so this  could be a post viral syndrome that is slowly resolving. Given the other symptoms, demyelinating disease like MS is a possibility, but her MRI brain did not show evidence of this. Other possibilities include sarcoidosis, lupus, or other rheumatologic disease. Patient is re-establishing with rheum next month, but I will send some labs today and monitor for new or changing symptoms.   PLAN: -Blood work: ANA, ENA, B12, B6, TSH -EMG: RUE (CTS protocol) -Over the counter Lidocaine cream recommended for tingling of right lateral thigh or right hand pain -Recommended weight loss as able for meralgia paraesthetica symptoms  Since their last visit: Labs were significant for low B12 and weakly positive ANA. I recommended patient supplement B12 with 1000 mcg of B12 daily. ***  EMG on 03/18/22 showed mild right carpal tunnel syndrome. I recommend wrist bracing for this.  Patient did not improve with lidocaine cream, so I prescribed Cymbalta 60 mg daily on 03/18/22.  MEDICATIONS:  Outpatient Encounter Medications as of 05/22/2023  Medication Sig   diclofenac (VOLTAREN) 75 MG EC tablet TAKE 1 TABLET BY MOUTH TWICE A DAY   DULoxetine (CYMBALTA) 30 MG capsule Take 2 capsules (60 mg total) by mouth at bedtime. Take 1 capsule (30 mg) at night for 1 week. If no side effects, then increase to 2 capsules (60 mg) thereafter.   ondansetron (ZOFRAN) 4 MG tablet Take 1 tablet (4 mg total) by mouth every 6 (six) hours as needed for nausea. (Patient not taking: Reported on 01/17/2022)   terbinafine (LAMISIL) 250 MG tablet Please take one a day x 7days, repeat every 4 weeks x 4 months (Patient not taking: Reported on 01/17/2022)   valACYclovir (VALTREX) 500 MG tablet Take 500 mg by mouth daily.    Vitamin D, Ergocalciferol, (DRISDOL) 1.25 MG (50000 UNIT) CAPS capsule Take 50,000 Units by mouth once a week.   No facility-administered encounter medications on file as of 05/22/2023.    PAST MEDICAL HISTORY: Past Medical  History:  Diagnosis Date   Anginal pain (HCC) 2019   Family history of adverse reaction to anesthesia    N&V   History of kidney stones     PAST SURGICAL HISTORY: Past Surgical History:  Procedure Laterality Date   ABDOMINAL HYSTERECTOMY     BREAST SURGERY Bilateral 2014   reduction   CESAREAN SECTION     x2   CYSTOSCOPY Bilateral 12/09/2019   Procedure: CYSTOSCOPY;  Surgeon: Carlisle Cater, MD;  Location:  [facility];  Service: Gynecology;  Laterality: Bilateral;   HERNIA REPAIR     umbilical   KNEE SURGERY Left    age 79   TONSILLECTOMY     TOTAL LAPAROSCOPIC HYSTERECTOMY WITH SALPINGECTOMY Bilateral 12/09/2019   Procedure: TOTAL LAPAROSCOPIC HYSTERECTOMY WITH SALPINGECTOMY,LYSIS OF ADHESIONS;  Surgeon: Carlisle Cater, MD;  Location:  SUR[facility]ERY CENTER;  Service: Gynecology;  Laterality: Bilateral;    ALLERGIES: Allergies  Allergen Reactions   Sulfa  Antibiotics Itching   Iodine Itching   Shellfish Allergy Itching and Swelling   Sulfamethoxazole-Trimethoprim Itching    FAMILY HISTORY: Family History  Problem Relation Age of Onset   Healthy Mother    Healthy Father     SOCIAL HISTORY: Social History   Tobacco Use   Smoking status: Never   Smokeless tobacco: Never  Vaping Use   Vaping status: Never Used  Substance Use Topics   Alcohol use: Yes   Drug use: Never   Social History   Social History Narrative   Right handed   Caffeine 4 cups daily   Live two level home with husband       Objective:  Vital Signs:  LMP 10/09/2019 (Approximate)   ***  Labs and Imaging review: New results: External labs (03/04/22): ESR wnl CCP negative  01/17/22: B12: 206 ANA positive (1:80, speckled pattern) TSH wnl B6 wnl ENA (RNP, dsDNA, Sm ab, Scl-70, SSA, SSB) negative  External labs 03/04/22: CCP negative ESR wnl CRP mildly elevated at 6.6  EMG (03/18/22): NCV & EMG Findings: Extensive electrodiagnostic evaluation of the  right upper limb shows: Right median, ulnar, and radial sensory responses are within normal limits. The right median palmar to ulnar palmar peak latency difference is abnormal (0.47 ms). Right median and ulnar motor responses are within normal limits. There is no evidence of active or chronic motor axon loss changes affecting any of the tested muscles. Motor unit configuration and recruitment pattern is within normal limits.   Impression: This is an abnormal electrodiagnostic study. The findings are most consistent with the following: Evidence of a right median mononeuropathy at or distal to the wrist, consistent with carpal tunnel syndrome, very mild in degree electrically. No electrodiagnostic evidence of a right cervical (C5-C8) motor radiculopathy. Screening studies for a right ulnar or radial mononeuropathy are normal.  Previously reviewed results: MRI brain w/wo contrast (05/23/21): FINDINGS: Brain:   Cerebral volume appears normal for age.   No cortical encephalomalacia is identified. No significant cerebral white matter disease.   There is no acute infarct.   No evidence of an intracranial mass.   No chronic intracranial blood products.   No extra-axial fluid collection.   No midline shift.   No pathologic intracranial enhancement identified.   Vascular: Maintained flow voids within the proximal large arterial vessels. Small developmental venous anomaly within the mid right frontal lobe (anatomic variant).   Skull and upper cervical spine: No focal suspicious marrow lesion. Generalized T1 hypointense marrow signal within the calvarium.   Sinuses/Orbits: Visualized orbits show no acute finding. Trace mucosal thickening within the bilateral ethmoid sinuses. 2.7 cm mucous retention cyst within the left maxillary sinus. Subcentimeter mucous retention cyst within the right maxillary sinus.   Other: Trace fluid within the left mastoid air cells.    IMPRESSION: Unremarkable MRI appearance of the brain. No evidence of acute intracranial abnormality.   Generalized T1 hypointense marrow signal within the calvarium. While this finding can reflect a marrow infiltrative process, the most common causes include chronic anemia, smoking and obesity.   Paranasal sinus disease, as described.   Trace left mastoid effusion.  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

## 2023-05-22 ENCOUNTER — Ambulatory Visit: Payer: BC Managed Care – PPO | Admitting: Neurology

## 2023-07-16 ENCOUNTER — Encounter (HOSPITAL_BASED_OUTPATIENT_CLINIC_OR_DEPARTMENT_OTHER): Payer: Self-pay | Admitting: Orthopedic Surgery

## 2023-07-23 ENCOUNTER — Encounter (HOSPITAL_BASED_OUTPATIENT_CLINIC_OR_DEPARTMENT_OTHER)
Admission: RE | Disposition: A | Discharge status: Home / Self Care | Payer: Self-pay | Source: Home / Self Care | Attending: Orthopedic Surgery

## 2023-07-23 ENCOUNTER — Ambulatory Visit (HOSPITAL_BASED_OUTPATIENT_CLINIC_OR_DEPARTMENT_OTHER): Payer: Self-pay | Admitting: Anesthesiology

## 2023-07-23 ENCOUNTER — Encounter (HOSPITAL_BASED_OUTPATIENT_CLINIC_OR_DEPARTMENT_OTHER): Payer: Self-pay | Admitting: Orthopedic Surgery

## 2023-07-23 ENCOUNTER — Other Ambulatory Visit: Payer: Self-pay

## 2023-07-23 ENCOUNTER — Ambulatory Visit (HOSPITAL_BASED_OUTPATIENT_CLINIC_OR_DEPARTMENT_OTHER)
Admission: RE | Admit: 2023-07-23 | Discharge: 2023-07-23 | Disposition: A | Discharge status: Home / Self Care | Attending: Orthopedic Surgery | Admitting: Orthopedic Surgery

## 2023-07-23 DIAGNOSIS — M65841 Other synovitis and tenosynovitis, right hand: Secondary | ICD-10-CM | POA: Insufficient documentation

## 2023-07-23 DIAGNOSIS — Z01818 Encounter for other preprocedural examination: Secondary | ICD-10-CM

## 2023-07-23 DIAGNOSIS — G5601 Carpal tunnel syndrome, right upper limb: Secondary | ICD-10-CM | POA: Diagnosis present

## 2023-07-23 HISTORY — PX: CARPAL TUNNEL RELEASE: SHX101

## 2023-07-23 HISTORY — PX: TRIGGER FINGER RELEASE: SHX641

## 2023-07-23 SURGERY — CARPAL TUNNEL RELEASE
Anesthesia: Monitor Anesthesia Care | Site: Wrist | Laterality: Right

## 2023-07-23 MED ORDER — MIDAZOLAM HCL 2 MG/2ML IJ SOLN
INTRAMUSCULAR | Status: AC
  Filled 2023-07-23: qty 2

## 2023-07-23 MED ORDER — BUPIVACAINE HCL 0.25 % IJ SOLN
INTRAMUSCULAR | Status: DC | PRN
  Administered 2023-07-23: 15 mL

## 2023-07-23 MED ORDER — OXYCODONE HCL 5 MG PO TABS: 5.0000 mg | ORAL_TABLET | Freq: Four times a day (QID) | ORAL | 0 refills | Status: AC | PRN

## 2023-07-23 MED ORDER — KETOROLAC TROMETHAMINE 30 MG/ML IJ SOLN
INTRAMUSCULAR | Status: DC | PRN
  Administered 2023-07-23: 30 mg via INTRAVENOUS

## 2023-07-23 MED ORDER — CEFAZOLIN SODIUM-DEXTROSE 2-4 GM/100ML-% IV SOLN
INTRAVENOUS | Status: AC
  Filled 2023-07-23: qty 100

## 2023-07-23 MED ORDER — DEXAMETHASONE SODIUM PHOSPHATE 4 MG/ML IJ SOLN
INTRAMUSCULAR | Status: DC | PRN
  Administered 2023-07-23: 5 mg via INTRAVENOUS

## 2023-07-23 MED ORDER — FENTANYL CITRATE (PF) 100 MCG/2ML IJ SOLN
INTRAMUSCULAR | Status: DC | PRN
  Administered 2023-07-23: 50 ug via INTRAVENOUS

## 2023-07-23 MED ORDER — LIDOCAINE HCL (CARDIAC) PF 100 MG/5ML IV SOSY
PREFILLED_SYRINGE | INTRAVENOUS | Status: DC | PRN
  Administered 2023-07-23: 50 mg via INTRAVENOUS

## 2023-07-23 MED ORDER — 0.9 % SODIUM CHLORIDE (POUR BTL) OPTIME
TOPICAL | Status: DC | PRN
  Administered 2023-07-23: 200 mL

## 2023-07-23 MED ORDER — PROPOFOL 500 MG/50ML IV EMUL
INTRAVENOUS | Status: DC | PRN
  Administered 2023-07-23: 75 ug/kg/min via INTRAVENOUS

## 2023-07-23 MED ORDER — ONDANSETRON HCL 4 MG/2ML IJ SOLN
INTRAMUSCULAR | Status: DC | PRN
  Administered 2023-07-23: 4 mg via INTRAVENOUS

## 2023-07-23 MED ORDER — CEFAZOLIN SODIUM-DEXTROSE 2-4 GM/100ML-% IV SOLN
2.0000 g | INTRAVENOUS | Status: AC
  Administered 2023-07-23: 2 g via INTRAVENOUS

## 2023-07-23 MED ORDER — DEXAMETHASONE SODIUM PHOSPHATE 10 MG/ML IJ SOLN
INTRAMUSCULAR | Status: AC
  Filled 2023-07-23: qty 1

## 2023-07-23 MED ORDER — BUPIVACAINE HCL (PF) 0.25 % IJ SOLN
INTRAMUSCULAR | Status: AC
  Filled 2023-07-23: qty 30

## 2023-07-23 MED ORDER — MIDAZOLAM HCL 5 MG/5ML IJ SOLN
INTRAMUSCULAR | Status: DC | PRN
  Administered 2023-07-23: 2 mg via INTRAVENOUS

## 2023-07-23 MED ORDER — LACTATED RINGERS IV SOLN: INTRAVENOUS | Status: DC

## 2023-07-23 MED ORDER — PROPOFOL 10 MG/ML IV BOLUS
INTRAVENOUS | Status: AC
  Filled 2023-07-23: qty 20

## 2023-07-23 MED ORDER — PROPOFOL 10 MG/ML IV BOLUS
INTRAVENOUS | Status: DC | PRN
  Administered 2023-07-23: 20 mg via INTRAVENOUS

## 2023-07-23 MED ORDER — FENTANYL CITRATE (PF) 100 MCG/2ML IJ SOLN
INTRAMUSCULAR | Status: AC
  Filled 2023-07-23: qty 2

## 2023-07-23 MED ORDER — KETOROLAC TROMETHAMINE 30 MG/ML IJ SOLN
INTRAMUSCULAR | Status: AC
  Filled 2023-07-23: qty 1

## 2023-07-23 MED ORDER — DEXMEDETOMIDINE HCL IN NACL 80 MCG/20ML IV SOLN
INTRAVENOUS | Status: AC
  Filled 2023-07-23: qty 20

## 2023-07-23 MED ORDER — ONDANSETRON HCL 4 MG/2ML IJ SOLN
INTRAMUSCULAR | Status: AC
  Filled 2023-07-23: qty 2

## 2023-07-23 SURGICAL SUPPLY — 28 items
BLADE SURG 15 STRL LF DISP TIS (BLADE) ×2 IMPLANT
BNDG ELASTIC 2INX 5YD STR LF (GAUZE/BANDAGES/DRESSINGS) ×2 IMPLANT
BNDG ELASTIC 3INX 5YD STR LF (GAUZE/BANDAGES/DRESSINGS) ×2 IMPLANT
BNDG ESMARK 4X9 LF (GAUZE/BANDAGES/DRESSINGS) ×2 IMPLANT
BNDG GAUZE DERMACEA FLUFF 4 (GAUZE/BANDAGES/DRESSINGS) ×2 IMPLANT
CHLORAPREP W/TINT 26 (MISCELLANEOUS) ×2 IMPLANT
CORD BIPOLAR FORCEPS 12FT (ELECTRODE) ×2 IMPLANT
COVER BACK TABLE 60X90IN (DRAPES) ×2 IMPLANT
CUFF TOURN SGL QUICK 18X4 (TOURNIQUET CUFF) ×2 IMPLANT
CUFF TRNQT CYL 24X4X16.5-23 (TOURNIQUET CUFF) IMPLANT
DRAPE EXTREMITY T 121X128X90 (DISPOSABLE) ×2 IMPLANT
DRAPE SURG 17X23 STRL (DRAPES) ×2 IMPLANT
GAUZE SPONGE 4X4 12PLY STRL (GAUZE/BANDAGES/DRESSINGS) IMPLANT
GAUZE XEROFORM 1X8 LF (GAUZE/BANDAGES/DRESSINGS) ×2 IMPLANT
GLOVE BIO SURGEON STRL SZ7 (GLOVE) ×2 IMPLANT
GLOVE BIOGEL PI IND STRL 7.0 (GLOVE) ×2 IMPLANT
GOWN STRL REUS W/ TWL LRG LVL3 (GOWN DISPOSABLE) ×4 IMPLANT
NDL HYPO 25X1 1.5 SAFETY (NEEDLE) ×2 IMPLANT
NEEDLE HYPO 25X1 1.5 SAFETY (NEEDLE) ×2 IMPLANT
NS IRRIG 1000ML POUR BTL (IV SOLUTION) ×2 IMPLANT
PACK BASIN DAY SURGERY FS (CUSTOM PROCEDURE TRAY) ×2 IMPLANT
SHEET MEDIUM DRAPE 40X70 STRL (DRAPES) ×2 IMPLANT
SUT ETHILON 4 0 PS 2 18 (SUTURE) ×2 IMPLANT
SUT VIC AB 4-0 PS2 18 (SUTURE) IMPLANT
SYR BULB EAR ULCER 3OZ GRN STR (SYRINGE) ×2 IMPLANT
SYR CONTROL 10ML LL (SYRINGE) ×2 IMPLANT
TOWEL GREEN STERILE FF (TOWEL DISPOSABLE) ×4 IMPLANT
UNDERPAD 30X36 HEAVY ABSORB (UNDERPADS AND DIAPERS) ×2 IMPLANT

## 2023-07-23 NOTE — Interval H&P Note (Signed)
 History and Physical Interval Note:  07/23/2023 8:24 AM  Nancy Reid  has presented today for surgery, with the diagnosis of Carpal tunnel syndrome of right wrist.  The various methods of treatment have been discussed with the patient and family. After consideration of risks, benefits and other options for treatment, the patient has consented to  Procedure(s): CARPAL TUNNEL RELEASE (Right) RELEASE, A1 PULLEY, FOR TRIGGER FINGER (Right) as a surgical intervention.  The patient's history has been reviewed, patient examined, no change in status, stable for surgery.  I have reviewed the patient's chart and labs.  Questions were answered to the patient's satisfaction.     [name]

## 2023-07-23 NOTE — H&P (Signed)
 HAND SURGERY   HPI: Patient is a 48 y.o. female who presents with right carpal tunnel syndrome and right middle finger stenosing tenosynovitis that has failed conservative management.  Patient denies any changes to their medical history or new systemic symptoms today.    Past Medical History:  Diagnosis Date   Anginal pain (HCC) 2019   Family history of adverse reaction to anesthesia    N&V   History of kidney stones    Past Surgical History:  Procedure Laterality Date   ABDOMINAL HYSTERECTOMY     BREAST SURGERY Bilateral 2014   reduction   CESAREAN SECTION     x2   CYSTOSCOPY Bilateral 12/09/2019   Procedure: CYSTOSCOPY;  Surgeon: Carlisle Cater, MD;  Location: [facility];  Service: Gynecology;  Laterality: Bilateral;   HERNIA REPAIR     umbilical   KNEE SURGERY Left    age 89   TONSILLECTOMY     TOTAL LAPAROSCOPIC HYSTERECTOMY WITH SALPINGECTOMY Bilateral 12/09/2019   Procedure: TOTAL LAPAROSCOPIC HYSTERECTOMY WITH SALPINGECTOMY,LYSIS OF ADHESIONS;  Surgeon: Carlisle Cater, MD;  Location: [facility];  Service: Gynecology;  Laterality: Bilateral;   Social History   Socioeconomic History   Marital status: Married    Spouse name: Not on file   Number of children: Not on file   Years of education: Not on file   Highest education level: Not on file  Occupational History   Not on file  Tobacco Use   Smoking status: Never   Smokeless tobacco: Never  Vaping Use   Vaping status: Never Used  Substance and Sexual Activity   Alcohol use: Yes    Comment: occ   Drug use: Never   Sexual activity: Yes    Birth control/protection: Surgical  Other Topics Concern   Not on file  Social History Narrative   Right handed   Caffeine 4 cups daily   Live two level home with husband    Social Drivers of Corporate investment banker Strain: Not on file  Food Insecurity: Not on file  Transportation Needs: Not on file  Physical Activity: Not on  file  Stress: Not on file  Social Connections: Not on file   Family History  Problem Relation Age of Onset   Healthy Mother    Healthy Father    - negative except otherwise stated in the family history section Allergies  Allergen Reactions   Sulfa Antibiotics Itching   Iodine Itching   Shellfish Allergy Itching and Swelling   Sulfamethoxazole-Trimethoprim Itching   Prior to Admission medications   Medication Sig Start Date End Date Taking? Authorizing Provider  diclofenac (VOLTAREN) 75 MG EC tablet TAKE 1 TABLET BY MOUTH TWICE A DAY 08/13/21  Yes Regal, Kirstie Peri, DPM  Vitamin D, Ergocalciferol, (DRISDOL) 1.25 MG (50000 UNIT) CAPS capsule Take 50,000 Units by mouth once a week. 08/27/19  Yes [provider]  valACYclovir (VALTREX) 500 MG tablet Take 500 mg by mouth daily.  05/28/19   [provider]   No results found. - Positive ROS: All other systems have been reviewed and were otherwise negative with the exception of those mentioned in the HPI and as above.  Physical Exam: General: No acute distress, resting comfortably Cardiovascular: BUE warm and well perfused, normal rate Respiratory: Normal WOB on RA Skin: Warm and dry Neurologic: Sensation intact distally Psychiatric: Patient is at baseline mood and affect  Right Upper Extremity  TTP over middle finger A1 pulley with  visible locking/catching.  Positive Tinel at wrist with positive Phalen sign but negative Durkan sign.  5/5 thenar motor strength.  SILT M/u/r distribution.  Hand warm and well perfused w/ BCR.    Assessment: 48 yo F w/ right CTS and middle trigger finger that has failed conservative management.   Plan: OR today for right CTR and right middle finger A1 pulley release. We again reviewed the risks of surgery which include bleeding, infection, damage to neurovascular structures, persistent symptoms, pillar pain or scar sensitivity, finger stiffness, need for additional surgery.  Informed  consent was signed.  All questions were answered.   Marlyne Beards, M.D. [facility] 8:21 AM

## 2023-07-23 NOTE — Transfer of Care (Signed)
 Immediate Anesthesia Transfer of Care Note  Patient: Nancy Reid  Procedure(s) Performed: CARPAL TUNNEL RELEASE (Right: Wrist) RELEASE, A1 PULLEY, FOR TRIGGER FINGER (Right: Finger)  Patient Location: PACU  Anesthesia Type:MAC  Level of Consciousness: awake, alert , and oriented  Airway & Oxygen Therapy: Patient Spontanous Breathing  Post-op Assessment: Report given to RN and Post -op Vital signs reviewed and stable  Post vital signs: Reviewed and stable  Last Vitals:  Vitals Value Taken Time  BP 127/77 07/23/23 0910  Temp 36.3 C 07/23/23 0910  Pulse 68 07/23/23 0912  Resp 14 07/23/23 0912  SpO2 99 % 07/23/23 0912  Vitals shown include unfiled device data.  Last Pain:  Vitals:   07/23/23 0720  TempSrc: Temporal  PainSc: 0-No pain      Patients Stated Pain Goal: 4 (07/23/23 0720)  Complications: No notable events documented.

## 2023-07-23 NOTE — Anesthesia Preprocedure Evaluation (Signed)
 Anesthesia Evaluation  Patient identified by MRN, date of birth, ID band Patient awake    Reviewed: Allergy & Precautions, NPO status , Patient's Chart, lab work & pertinent test results  Airway Mallampati: II  TM Distance: >3 FB Neck ROM: Full    Dental no notable dental hx.    Pulmonary neg pulmonary ROS   Pulmonary exam normal breath sounds clear to auscultation       Cardiovascular Normal cardiovascular exam Rhythm:Regular Rate:Normal  Hx of angina with negative cath   Neuro/Psych negative neurological ROS  negative psych ROS   GI/Hepatic negative GI ROS, Neg liver ROS,,,  Endo/Other  negative endocrine ROS    Renal/GU negative Renal ROS     Musculoskeletal Right hand numbness    Abdominal  (+) + obese  Peds  Hematology negative hematology ROS (+)   Anesthesia Other Findings   Reproductive/Obstetrics                              Anesthesia Physical Anesthesia Plan  ASA: 2  Anesthesia Plan: MAC   Post-op Pain Management:    Induction: Intravenous  PONV Risk Score and Plan: 1 and Ondansetron, Midazolam and Treatment may vary due to age or medical condition  Airway Management Planned: Natural Airway and Simple Face Mask  Additional Equipment:   Intra-op Plan:   Post-operative Plan:   Informed Consent: I have reviewed the patients History and Physical, chart, labs and discussed the procedure including the risks, benefits and alternatives for the proposed anesthesia with the patient or authorized representative who has indicated his/her understanding and acceptance.     Dental advisory given  Plan Discussed with: CRNA  Anesthesia Plan Comments:          Anesthesia Quick Evaluation

## 2023-07-23 NOTE — Op Note (Signed)
 Date of Surgery: 07/23/2023  INDICATIONS: Patient is a 48 y.o.-year-old female with right carpal tunnel syndrome and right middle finger stenosing tenosynovitis that has failed conservative management.  Risks, benefits, and alternatives to surgery were again discussed with the patient in the preoperative area. The patient wishes to proceed with surgery.  Informed consent was signed after our discussion.   PREOPERATIVE DIAGNOSIS:  Right carpal tunnel syndrome Right middle finger stenosing tenosynovitis.   POSTOPERATIVE DIAGNOSIS: Same.  PROCEDURE:  Right carpal tunnel release Right middle finger A1 pulley release   SURGEON: Waylan Rocher, M.D.  ASSIST:   ANESTHESIA:  Local, MAC  IV FLUIDS AND URINE: See anesthesia.  ESTIMATED BLOOD LOSS: <5 mL.  IMPLANTS: * No implants in log *   DRAINS: None  COMPLICATIONS: None noted  DESCRIPTION OF PROCEDURE: The patient was met in the preoperative holding area where the surgical site was marked and the informed consent form was signed.  The patient was then brought back to the operating room and remained on the stretcher.  A hand table was placed adjacent to the operative extremity and locked into place.  A tourniquet was placed on the right forearm.  A formal timeout was performed to confirm that this was the correct patient, surgical side, surgical site, and surgical procedure.  All were present and in agreement. Following formal timeout, a local block was performed using 15 mL of 0.25% plain marcaine.  The right upper extremity was then prepped and draped in the usual and sterile fashion.   Following a second timeout, the limb was exsanguinated and the tourniquet inflated to 250 mmHg.  A longitudinal incision was made in line with the radial border of the ring finger from distal to the wrist flexion crease to the intersection of Kaplan's cardinal line.  The skin and subcutaneous tissue was sharply divided.  The longitudinally running  palmar fascia was incised.  The thenar musculature was bluntly swept off of the transverse carpal ligament.  The ligament was divided from proximal to distal until the fat surrounding the palmar arch was encountered. Retractors were then placed in the proximal aspect of the wound to visualize the distal antebrachial fascia.  The fascia was sharply divided under direct visualization.   The wound was then thoroughly irrigated with sterile saline.     Next, a longitudinal incision was made over the A1 pulley.  The skin was incised.  Blunt dissection was used to identify the A1 pulley.  Two Ragnell retractors were placed on the radial and ulnar sides of the pulley to protect the respective neurovascular bundles.  A third Ragnell was placed at the distal aspect of the wound.  The A1 pulley was clearly identified.  Under direct visualization, the pulley was entered sharply using a 15 blade.  Tenotomy scissors were used to complete the pulley release distally to the level of the A2 pulley.  The distal retractor was then placed in the proximal aspect of the wound.  Under direct visualization, the proximal aspect of the A1 pulley was completely released.   Following satisfactory A1 pulley release, the patient was reversed from sedation and asked to fully flex and extend the involved finger.  There was no catching or triggering present.  The tourniquet was let down and hemostasis achieved with direct pressure and bipolar electrocautery.  The wounds was then thoroughly irrigated.  They were closed using 4-0 nylon sutures in a horizontal mattress fashion.  The wounds were dressed with xeroform, kerlix, 4x4, and an  ace wrap.   All counts were correct x 2 at the end of the procedure.  The patient was then taken to the PACU in stable condition.     POSTOPERATIVE PLAN: She will be discharged to home with appropriate pain medication and discharge instructions.  I'll see her back in 10-14 days for her first postop visit.    Waylan Rocher, MD 9:14 AM

## 2023-07-23 NOTE — Discharge Instructions (Addendum)
 Nancy Reid, M.D. Hand Surgery  POST-OPERATIVE DISCHARGE INSTRUCTIONS   PRESCRIPTIONS: - You may have been given a prescription to be taken as directed for post-operative pain control.  You may also take over the counter ibuprofen/aleve and tylenol for pain. Take this as directed on the packaging. Do not exceed 3000 mg tylenol/acetaminophen in 24 hours.  Ibuprofen 600-800 mg (3-4) tablets by mouth every 6 hours as needed for pain.   OR  Aleve 2 tablets by mouth every 12 hours (twice daily) as needed for pain.   AND/OR  Tylenol 1000 mg (2 tablets) every 8 hours as needed for pain.  - Please use your pain medication carefully, as refills are limited and you may not be provided with one.  As stated above, please use over the counter pain medicine - it will also be helpful with decreasing your swelling.    ANESTHESIA: -After your surgery, post-surgical discomfort or pain is likely. This discomfort can last several days to a few weeks. At certain times of the day your discomfort may be more intense.   Did you receive a nerve block?   - A nerve block can provide pain relief for one hour to two days after your surgery. As long as the nerve block is working, you will experience little or no sensation in the area the surgeon operated on.  - As the nerve block wears off, you will begin to experience pain or discomfort. It is very important that you begin taking your prescribed pain medication before the nerve block fully wears off. Treating your pain at the first sign of the block wearing off will ensure your pain is better controlled and more tolerable when full-sensation returns. Do not wait until the pain is intolerable, as the medicine will be less effective. It is better to treat pain in advance than to try and catch up.   General Anesthesia:  If you did not receive a nerve block during your surgery, you will need to start taking your pain medication shortly after your surgery and  should continue to do so as prescribed by your surgeon.     ICE AND ELEVATION: - You may use ice for the first 48-72 hours, but it is not critical.   - Motion of your fingers is very important to decrease the swelling.  - Elevation, as much as possible for the next 48 hours, is critical for decreasing swelling as well as for pain relief. Elevation means when you are seated or lying down, you hand should be at or above your heart. When walking, the hand needs to be at or above the level of your elbow.  - If the bandage gets too tight, it may need to be loosened. Please contact our office and we will instruct you in how to do this.    SURGICAL BANDAGES:  - Keep your dressing and/or splint clean and dry at all times.  You can remove your dressing 7 days from now and change with a dry dressing or Band-Aids as needed thereafter. - You may place a plastic bag over your bandage to shower, but be careful, do not get your bandages wet.  - After the bandages have been removed, it is OK to get the stitches wet in a shower or with hand washing. Do Not soak or submerge the wound yet. Please do not use lotions or creams on the stitches.      HAND THERAPY:  - You may not need any. If you  do, we will begin this at your follow up visit in the clinic.    ACTIVITY AND WORK: - You are encouraged to move any fingers which are not in the bandage.  - Light use of the fingers is allowed to assist the other hand with daily hygiene and eating, but strong gripping or lifting is often uncomfortable and should be avoided.  - You might miss a variable period of time from work and hopefully this issue has been discussed prior to surgery. You may not do any heavy work with your affected hand for about 2 weeks.    [facility] Second Floor, [address] [place], [place] [address] [phone]     Post Anesthesia Home Care Instructions  Activity: Get plenty of rest for the remainder of the day. A  responsible individual must stay with you for 24 hours following the procedure.  For the next 24 hours, DO NOT: -Drive a car -Advertising copywriter -Drink alcoholic beverages -Take any medication unless instructed by your physician -Make any legal decisions or sign important papers.  Meals: Start with liquid foods such as gelatin or soup. Progress to regular foods as tolerated. Avoid greasy, spicy, heavy foods. If nausea and/or vomiting occur, drink only clear liquids until the nausea and/or vomiting subsides. Call your physician if vomiting continues.  Special Instructions/Symptoms: Your throat may feel dry or sore from the anesthesia or the breathing tube placed in your throat during surgery. If this causes discomfort, gargle with warm salt water. The discomfort should disappear within 24 hours.  If you had a scopolamine patch placed behind your ear for the management of post- operative nausea and/or vomiting:  1. The medication in the patch is effective for 72 hours, after which it should be removed.  Wrap patch in a tissue and discard in the trash. Wash hands thoroughly with soap and water. 2. You may remove the patch earlier than 72 hours if you experience unpleasant side effects which may include dry mouth, dizziness or visual disturbances. 3. Avoid touching the patch. Wash your hands with soap and water after contact with the patch.     Next dose of NSAIDs (Ibuprofen/Motrin/Aleve) may be given 5:00pm if needed.

## 2023-07-23 NOTE — Anesthesia Procedure Notes (Signed)
 Procedure Name: MAC Date/Time: 07/23/2023 8:30 AM  Performed by: Cleda Clarks, CRNAPre-anesthesia Checklist: Patient identified, Emergency Drugs available, Suction available, Patient being monitored and Timeout performed Patient Re-evaluated:Patient Re-evaluated prior to induction Oxygen Delivery Method: Simple face mask Preoxygenation: Pre-oxygenation with 100% oxygen Placement Confirmation: positive ETCO2

## 2023-07-23 NOTE — Anesthesia Postprocedure Evaluation (Signed)
 Anesthesia Post Note  Patient: Chief Operating Officer  Procedure(s) Performed: CARPAL TUNNEL RELEASE (Right: Wrist) RELEASE, A1 PULLEY, FOR TRIGGER FINGER (Right: Finger)     Patient location during evaluation: PACU Anesthesia Type: MAC Level of consciousness: awake and alert Pain management: pain level controlled Vital Signs Assessment: post-procedure vital signs reviewed and stable Respiratory status: spontaneous breathing, nonlabored ventilation, respiratory function stable and patient connected to nasal cannula oxygen Cardiovascular status: stable and blood pressure returned to baseline Postop Assessment: no apparent nausea or vomiting Anesthetic complications: no   No notable events documented.  Last Vitals:  Vitals:   07/23/23 0927 07/23/23 0936  BP: (!) 134/90 134/81  Pulse: 66 70  Resp: 14   Temp:  (!) 36.4 C  SpO2: 96% 98%    Last Pain:  Vitals:   07/23/23 0936  TempSrc: Temporal  PainSc: 0-No pain                 Olivarez Nation

## 2023-07-24 ENCOUNTER — Encounter (HOSPITAL_BASED_OUTPATIENT_CLINIC_OR_DEPARTMENT_OTHER): Payer: Self-pay | Admitting: Orthopedic Surgery

## 2024-03-25 ENCOUNTER — Other Ambulatory Visit: Payer: Self-pay | Admitting: Family Medicine

## 2024-03-25 DIAGNOSIS — R1011 Right upper quadrant pain: Secondary | ICD-10-CM

## 2024-04-06 ENCOUNTER — Ambulatory Visit
Admission: RE | Admit: 2024-04-06 | Discharge: 2024-04-06 | Disposition: A | Source: Ambulatory Visit | Attending: Family Medicine | Admitting: Family Medicine

## 2024-04-06 ENCOUNTER — Other Ambulatory Visit

## 2024-04-06 DIAGNOSIS — R1011 Right upper quadrant pain: Secondary | ICD-10-CM

## 2024-05-22 ENCOUNTER — Other Ambulatory Visit: Payer: Self-pay

## 2024-05-22 ENCOUNTER — Emergency Department (HOSPITAL_BASED_OUTPATIENT_CLINIC_OR_DEPARTMENT_OTHER)
Admission: EM | Admit: 2024-05-22 | Discharge: 2024-05-22 | Disposition: A | Discharge status: Home / Self Care | Attending: Emergency Medicine | Admitting: Emergency Medicine

## 2024-05-22 ENCOUNTER — Encounter (HOSPITAL_BASED_OUTPATIENT_CLINIC_OR_DEPARTMENT_OTHER): Payer: Self-pay | Admitting: Emergency Medicine

## 2024-05-22 ENCOUNTER — Emergency Department (HOSPITAL_BASED_OUTPATIENT_CLINIC_OR_DEPARTMENT_OTHER)

## 2024-05-22 DIAGNOSIS — R1011 Right upper quadrant pain: Secondary | ICD-10-CM | POA: Diagnosis present

## 2024-05-22 DIAGNOSIS — L750 Bromhidrosis: Secondary | ICD-10-CM | POA: Diagnosis not present

## 2024-05-22 DIAGNOSIS — R1010 Upper abdominal pain, unspecified: Secondary | ICD-10-CM

## 2024-05-22 LAB — COMPREHENSIVE METABOLIC PANEL WITH GFR
ALT: 15 U/L (ref 0–44)
AST: 18 U/L (ref 15–41)
Albumin: 4.4 g/dL (ref 3.5–5.0)
Alkaline Phosphatase: 92 U/L (ref 38–126)
Anion gap: 13 (ref 5–15)
BUN: 13 mg/dL (ref 6–20)
CO2: 25 mmol/L (ref 22–32)
Calcium: 9.7 mg/dL (ref 8.9–10.3)
Chloride: 102 mmol/L (ref 98–111)
Creatinine, Ser: 0.89 mg/dL (ref 0.44–1.00)
GFR, Estimated: >60 mL/min
Glucose, Bld: 149 mg/dL — ABNORMAL HIGH (ref 70–99)
Potassium: 3.8 mmol/L (ref 3.5–5.1)
Sodium: 140 mmol/L (ref 135–145)
Total Bilirubin: <0.2 mg/dL (ref 0.0–1.2)
Total Protein: 7.6 g/dL (ref 6.5–8.1)

## 2024-05-22 LAB — CBC
HCT: 39.9 % (ref 36.0–46.0)
Hemoglobin: 13 g/dL (ref 12.0–15.0)
MCH: 29.6 pg (ref 26.0–34.0)
MCHC: 32.6 g/dL (ref 30.0–36.0)
MCV: 90.9 fL (ref 80.0–100.0)
Platelets: 258 K/uL (ref 150–400)
RBC: 4.39 MIL/uL (ref 3.87–5.11)
RDW: 12.7 % (ref 11.5–15.5)
WBC: 5.9 K/uL (ref 4.0–10.5)
nRBC: 0 % (ref 0.0–0.2)

## 2024-05-22 LAB — URINALYSIS, ROUTINE W REFLEX MICROSCOPIC
Bilirubin Urine: NEGATIVE
Glucose, UA: NEGATIVE mg/dL
Hgb urine dipstick: NEGATIVE
Ketones, ur: NEGATIVE mg/dL
Leukocytes,Ua: NEGATIVE
Nitrite: NEGATIVE
Protein, ur: NEGATIVE mg/dL
Specific Gravity, Urine: 1.022 (ref 1.005–1.030)
pH: 6 (ref 5.0–8.0)

## 2024-05-22 LAB — LIPASE, BLOOD: Lipase: 32 U/L (ref 11–51)

## 2024-05-22 MED ORDER — SUCRALFATE 1 G PO TABS: 1.0000 g | ORAL_TABLET | Freq: Three times a day (TID) | ORAL | 1 refills | Status: AC

## 2024-05-22 MED ORDER — FAMOTIDINE 20 MG PO TABS: 20.0000 mg | ORAL_TABLET | Freq: Two times a day (BID) | ORAL | 0 refills | Status: AC

## 2024-05-22 MED ORDER — PANTOPRAZOLE SODIUM 20 MG PO TBEC: 20.0000 mg | DELAYED_RELEASE_TABLET | Freq: Every day | ORAL | 0 refills | Status: AC

## 2024-05-22 NOTE — ED Notes (Signed)
 Patient transported to CT

## 2024-05-22 NOTE — ED Provider Notes (Signed)
 " [place] EMERGENCY DEPARTMENT AT [facility] Provider Note   CSN: [id number] Arrival date & time: 05/22/24  1939     Patient presents with: Flank Pain   Nancy Reid is a 49 y.o. female.   Patient with history of hernia repair in the past presents to the emergency department for evaluation of ongoing right sided upper abdominal pain.  Patient states that this has been present for a couple of months.  This is associated with nausea without vomiting or diarrhea.  No fevers.  No chest pain or shortness of breath.  Patient was evaluated with ultrasound on 04/06/2024.  This showed possible fatty liver.  Patient is also concerned as she has had recent belching and a sour taste in her mouth.  She is also concerned about body odor that smells like a chemical.  She is concerned about toxins coming out of her pores.  She was referred to gastroenterology but does not yet have an appointment.  She denies typical reflux type symptoms.       Prior to Admission medications  Medication Sig Start Date End Date Taking? Authorizing Provider  diclofenac  (VOLTAREN ) 75 MG EC tablet TAKE 1 TABLET BY MOUTH TWICE A DAY 08/13/21   Magdalen Pasco RAMAN, DPM  valACYclovir (VALTREX) 500 MG tablet Take 500 mg by mouth daily.  05/28/19   [provider]  Vitamin D, Ergocalciferol, (DRISDOL) 1.25 MG (50000 UNIT) CAPS capsule Take 50,000 Units by mouth once a week. 08/27/19   [provider]    Allergies: Sulfa antibiotics, Iodine, Shellfish allergy, and Sulfamethoxazole-trimethoprim    Review of Systems  Updated Vital Signs BP 117/83   Pulse 81   Temp 98 F (36.7 C)   Resp 17   LMP 11/01/2019   SpO2 99%   Physical Exam Vitals and nursing note reviewed.  Constitutional:      General: She is not in acute distress.    Appearance: She is well-developed.  HENT:     Head: Normocephalic and atraumatic.     Right Ear: External ear normal.     Left Ear: External ear normal.     Nose:  Nose normal.  Eyes:     Conjunctiva/sclera: Conjunctivae normal.  Cardiovascular:     Rate and Rhythm: Normal rate and regular rhythm.     Heart sounds: No murmur heard. Pulmonary:     Effort: No respiratory distress.     Breath sounds: No wheezing, rhonchi or rales.  Abdominal:     Palpations: Abdomen is soft.     Tenderness: There is abdominal tenderness in the right upper quadrant and epigastric area. There is no guarding or rebound.  Musculoskeletal:     Cervical back: Normal range of motion and neck supple.     Right lower leg: No edema.     Left lower leg: No edema.  Skin:    General: Skin is warm and dry.     Findings: No rash.  Neurological:     General: No focal deficit present.     Mental Status: She is alert. Mental status is at baseline.     Motor: No weakness.  Psychiatric:        Mood and Affect: Mood normal.     (all labs ordered are listed, but only abnormal results are displayed) Labs Reviewed  COMPREHENSIVE METABOLIC PANEL WITH GFR - Abnormal; Notable for the following components:      Result Value   Glucose, Bld 149 (*)  All other components within normal limits  LIPASE, BLOOD  CBC  URINALYSIS, ROUTINE W REFLEX MICROSCOPIC    EKG: None  Radiology: CT ABDOMEN PELVIS WO CONTRAST Result Date: 05/22/2024 EXAM: CT ABDOMEN AND PELVIS WITHOUT CONTRAST 05/22/2024 10:38:34 PM TECHNIQUE: CT of the abdomen and pelvis was performed without the administration of intravenous contrast. Multiplanar reformatted images are provided for review. Automated exposure control, iterative reconstruction, and/or weight-based adjustment of the mA/kV was utilized to reduce the radiation dose to as low as reasonably achievable. COMPARISON: None available. CLINICAL HISTORY: RUQ pain. FINDINGS: LOWER CHEST: No acute abnormality. LIVER: The liver is enlarged. GALLBLADDER AND BILE DUCTS: Gallbladder is unremarkable. No biliary ductal dilatation. SPLEEN: No acute abnormality. PANCREAS:  No acute abnormality. ADRENAL GLANDS: No acute abnormality. KIDNEYS, URETERS AND BLADDER: No stones in the kidneys or ureters. No hydronephrosis. No perinephric or periureteral stranding. Urinary bladder is unremarkable. GI AND BOWEL: Stomach demonstrates no acute abnormality. The appendix appears within normal limits. There is no bowel obstruction. PERITONEUM AND RETROPERITONEUM: No ascites. No free air. VASCULATURE: Aorta is normal in caliber. LYMPH NODES: No lymphadenopathy. REPRODUCTIVE ORGANS: No acute abnormality. BONES AND SOFT TISSUES: No acute osseous abnormality. There is a small supraumbilical midline ventral hernia containing mesenteric fat. IMPRESSION: 1. No acute findings. 2. Enlarged liver. Electronically signed by: Greig Pique MD 05/22/2024 10:57 PM EST RP Workstation: [id number]     Procedures   Medications Ordered in the ED - No data to display  ED Course  Patient seen and examined. History obtained directly from patient.  Reviewed previous ultrasound report.  Labs/EKG: Independently reviewed and interpreted.  This included: CBC unremarkable; CMP demonstrates glucose 149 otherwise unremarkable; lipase normal; UA negative.  Imaging: Discussed imaging with patient.  We discussed potentially just treating her symptoms versus obtaining additional information tonight with CT.  After discussion with patient, she would prefer additional imaging.  Ordered CT.  Medications/Fluids: None ordered  Most recent vital signs reviewed and are as follows: BP 117/83   Pulse 81   Temp 98 F (36.7 C)   Resp 17   LMP 11/01/2019   SpO2 99%   Initial impression: Upper abdominal pain, favoring the right side a bit.  This is subacute to chronic at this point.  Labs are reassuring.  Awaiting CT for further evaluation.  Patient did raise concerns about an odor that she is smelly from her body that is not improved with cleaning or washing.  It is unclear to me what could be causing this.  We did  discuss symptoms of reflux.  I do not have additional testing for this specific complaint.  Patient verbalizes understanding.  11:31 PM Reassessment performed. Patient appears stable, comfortable.  Imaging personally visualized and interpreted including: CT scan suggesting enlarged liver, no other acute findings.  Reviewed pertinent lab work and imaging with patient at bedside. Questions answered.   Most current vital signs reviewed and are as follows: BP 117/83   Pulse 81   Temp 98 F (36.7 C)   Resp 17   LMP 11/01/2019   SpO2 99%   Plan: Discharge to home.  Will give prescription for Protonix , Pepcid , Carafate  to try to see if this helps symptoms.  Other home care instructions discussed: Close monitoring of symptoms  ED return instructions discussed: The patient was urged to return to the Emergency Department immediately with worsening of current symptoms, worsening abdominal pain, persistent vomiting, blood noted in stools, fever, or any other concerns. The patient verbalized understanding.  Follow-up instructions discussed: Patient encouraged to follow-up with their PCP in 5 days.                                   Medical Decision Making Amount and/or Complexity of Data Reviewed Labs: ordered. Radiology: ordered.   For this patient's complaint of abdominal pain, the following conditions were considered on the differential diagnosis: gastritis/PUD, enteritis/duodenitis, appendicitis, cholelithiasis/cholecystitis, cholangitis, pancreatitis, ruptured viscus, colitis, diverticulitis, small/large bowel obstruction, proctitis, cystitis, pyelonephritis, ureteral colic, aortic dissection, aortic aneurysm. In women, ectopic pregnancy, pelvic inflammatory disease, ovarian cysts, and tubo-ovarian abscess were also considered. Atypical chest etiologies were also considered including ACS, PE, and pneumonia.  CT imaging today was reassuring.  Will give trial of medication for  gastritis/GERD.  Discussed that this may or may not help symptoms, but may be worth trying prior to GI follow-up.  The patient's vital signs, pertinent lab work and imaging were reviewed and interpreted as discussed in the ED course. Hospitalization was considered for further testing, treatments, or serial exams/observation. However as patient is well-appearing, has a stable exam, and reassuring studies today, I do not feel that they warrant admission at this time. This plan was discussed with the patient who verbalizes agreement and comfort with this plan and seems reliable and able to return to the Emergency Department with worsening or changing symptoms.       Final diagnoses:  Upper abdominal pain  Abnormal body odor    ED Discharge Orders     None          Desiderio Chew, PA-C 05/22/24 2336  "

## 2024-05-22 NOTE — Discharge Instructions (Signed)
 Please read and follow all provided instructions.  Your diagnoses today include:  1. Upper abdominal pain   2. Abnormal body odor     Tests performed today include: Complete blood cell count: Was normal Complete metabolic panel: Showed slightly high blood sugar otherwise normal Lipase (pancreas function test): Was normal Urinalysis (urine test): Was negative for infection CT scan of the abdomen pelvis suggested enlargement of the liver, otherwise no acute findings Vital signs. See below for your results today.   Medications prescribed:  Pantoprazole  - stomach acid reducer (Take this medication everyday)  Pepcid  (famotidine ) -stomach acid reducer (Take this medication twice a day for one week)  Carafate  - for stomach upset and to protect your stomach (Take with meals and at bedtime)  Take any prescribed medications only as directed.  Home care instructions:  Follow any educational materials contained in this packet.  Follow-up instructions: Please follow-up with your primary care provider in the next 7 days for further evaluation of your symptoms.    Return instructions:  SEEK IMMEDIATE MEDICAL ATTENTION IF: The pain does not go away or becomes severe  A temperature above 101F develops  Repeated vomiting occurs (multiple episodes)  The pain becomes localized to portions of the abdomen. The right side could possibly be appendicitis. In an adult, the left lower portion of the abdomen could be colitis or diverticulitis.  Blood is being passed in stools or vomit (bright red or black tarry stools)  You develop chest pain, difficulty breathing, dizziness or fainting, or become confused, poorly responsive, or inconsolable (young children) If you have any other emergent concerns regarding your health  Additional Information: Abdominal (belly) pain can be caused by many things. Your caregiver performed an examination and possibly ordered blood/urine tests and imaging (CT scan, x-rays,  ultrasound). Many cases can be observed and treated at home after initial evaluation in the emergency department. Even though you are being discharged home, abdominal pain can be unpredictable. Therefore, you need a repeated exam if your pain does not resolve, returns, or worsens. Most patients with abdominal pain don't have to be admitted to the hospital or have surgery, but serious problems like appendicitis and gallbladder attacks can start out as nonspecific pain. Many abdominal conditions cannot be diagnosed in one visit, so follow-up evaluations are very important.  Your vital signs today were: BP 117/83   Pulse 81   Temp 98 F (36.7 C)   Resp 17   LMP 11/01/2019   SpO2 99%  If your blood pressure (bp) was elevated above 135/85 this visit, please have this repeated by your doctor within one month. --------------

## 2024-05-22 NOTE — ED Triage Notes (Signed)
 Recent US  shows possible fatty liver. Experiencing flank and back pain. Reports a body odor-smells like a cleaning chemical. Denies CP SOB. +N/-V/-D.

## 2024-06-10 ENCOUNTER — Ambulatory Visit

## 2024-07-13 ENCOUNTER — Ambulatory Visit
